# Patient Record
Sex: Male | Born: 1950 | Race: Black or African American | Hispanic: No | Marital: Single | Smoking: Former smoker
Health system: Southern US, Community
[De-identification: ages and names within clinical notes are randomized; demographics above are authoritative.]

## PROBLEM LIST (undated history)

## (undated) DIAGNOSIS — E119 Type 2 diabetes mellitus without complications: Secondary | ICD-10-CM

## (undated) DIAGNOSIS — I639 Cerebral infarction, unspecified: Secondary | ICD-10-CM

## (undated) DIAGNOSIS — N289 Disorder of kidney and ureter, unspecified: Secondary | ICD-10-CM

## (undated) DIAGNOSIS — C801 Malignant (primary) neoplasm, unspecified: Secondary | ICD-10-CM

## (undated) DIAGNOSIS — D649 Anemia, unspecified: Secondary | ICD-10-CM

## (undated) DIAGNOSIS — I10 Essential (primary) hypertension: Secondary | ICD-10-CM

## (undated) DIAGNOSIS — R569 Unspecified convulsions: Secondary | ICD-10-CM

---

## 2000-07-25 ENCOUNTER — Emergency Department (HOSPITAL_COMMUNITY): Admission: EM | Admit: 2000-07-25 | Discharge: 2000-07-25 | Payer: Self-pay | Admitting: *Deleted

## 2001-03-26 ENCOUNTER — Emergency Department (HOSPITAL_COMMUNITY): Admission: EM | Admit: 2001-03-26 | Discharge: 2001-03-26 | Payer: Self-pay | Admitting: *Deleted

## 2004-11-10 ENCOUNTER — Emergency Department (HOSPITAL_COMMUNITY): Admission: EM | Admit: 2004-11-10 | Discharge: 2004-11-10 | Payer: Self-pay | Admitting: Emergency Medicine

## 2005-06-27 ENCOUNTER — Inpatient Hospital Stay (HOSPITAL_COMMUNITY): Admission: EM | Admit: 2005-06-27 | Discharge: 2005-07-03 | Payer: Self-pay | Admitting: Emergency Medicine

## 2005-06-29 ENCOUNTER — Ambulatory Visit: Payer: Self-pay | Admitting: Internal Medicine

## 2005-07-02 ENCOUNTER — Ambulatory Visit: Payer: Self-pay | Admitting: *Deleted

## 2005-07-03 ENCOUNTER — Encounter (INDEPENDENT_AMBULATORY_CARE_PROVIDER_SITE_OTHER): Payer: Self-pay | Admitting: Specialist

## 2005-07-09 ENCOUNTER — Ambulatory Visit: Payer: Self-pay | Admitting: *Deleted

## 2005-07-14 ENCOUNTER — Ambulatory Visit: Payer: Self-pay | Admitting: Internal Medicine

## 2005-07-28 ENCOUNTER — Ambulatory Visit: Payer: Self-pay | Admitting: *Deleted

## 2005-08-04 ENCOUNTER — Ambulatory Visit: Payer: Self-pay | Admitting: *Deleted

## 2005-08-04 ENCOUNTER — Encounter (HOSPITAL_COMMUNITY): Admission: RE | Admit: 2005-08-04 | Discharge: 2005-09-03 | Payer: Self-pay | Admitting: *Deleted

## 2005-08-11 ENCOUNTER — Ambulatory Visit: Payer: Self-pay | Admitting: *Deleted

## 2005-08-24 ENCOUNTER — Ambulatory Visit: Payer: Self-pay | Admitting: Cardiology

## 2005-11-12 ENCOUNTER — Ambulatory Visit: Payer: Self-pay | Admitting: Internal Medicine

## 2005-11-13 ENCOUNTER — Ambulatory Visit (HOSPITAL_COMMUNITY): Payer: Self-pay | Admitting: Internal Medicine

## 2005-11-13 ENCOUNTER — Encounter (HOSPITAL_COMMUNITY): Admission: RE | Admit: 2005-11-13 | Discharge: 2005-12-13 | Payer: Self-pay | Admitting: Internal Medicine

## 2006-03-02 ENCOUNTER — Ambulatory Visit: Payer: Self-pay | Admitting: Internal Medicine

## 2006-03-08 ENCOUNTER — Ambulatory Visit (HOSPITAL_COMMUNITY): Admission: RE | Admit: 2006-03-08 | Discharge: 2006-03-08 | Payer: Self-pay | Admitting: Internal Medicine

## 2006-03-08 ENCOUNTER — Ambulatory Visit: Payer: Self-pay | Admitting: Internal Medicine

## 2006-03-15 ENCOUNTER — Ambulatory Visit: Payer: Self-pay | Admitting: Internal Medicine

## 2006-03-15 ENCOUNTER — Ambulatory Visit (HOSPITAL_COMMUNITY): Admission: RE | Admit: 2006-03-15 | Discharge: 2006-03-15 | Payer: Self-pay | Admitting: Internal Medicine

## 2007-10-10 ENCOUNTER — Emergency Department (HOSPITAL_COMMUNITY): Admission: EM | Admit: 2007-10-10 | Discharge: 2007-10-10 | Payer: Self-pay | Admitting: Emergency Medicine

## 2007-12-30 ENCOUNTER — Ambulatory Visit: Payer: Self-pay | Admitting: Gastroenterology

## 2008-05-25 DIAGNOSIS — F1210 Cannabis abuse, uncomplicated: Secondary | ICD-10-CM | POA: Insufficient documentation

## 2008-05-25 DIAGNOSIS — E119 Type 2 diabetes mellitus without complications: Secondary | ICD-10-CM | POA: Insufficient documentation

## 2008-05-25 DIAGNOSIS — E785 Hyperlipidemia, unspecified: Secondary | ICD-10-CM | POA: Insufficient documentation

## 2008-05-25 DIAGNOSIS — K922 Gastrointestinal hemorrhage, unspecified: Secondary | ICD-10-CM | POA: Insufficient documentation

## 2008-05-25 DIAGNOSIS — R569 Unspecified convulsions: Secondary | ICD-10-CM

## 2008-05-25 DIAGNOSIS — F17200 Nicotine dependence, unspecified, uncomplicated: Secondary | ICD-10-CM

## 2008-05-25 DIAGNOSIS — I6350 Cerebral infarction due to unspecified occlusion or stenosis of unspecified cerebral artery: Secondary | ICD-10-CM | POA: Insufficient documentation

## 2008-05-28 ENCOUNTER — Ambulatory Visit: Payer: Self-pay | Admitting: Internal Medicine

## 2008-05-28 ENCOUNTER — Encounter: Payer: Self-pay | Admitting: Gastroenterology

## 2008-05-31 LAB — CONVERTED CEMR LAB
HCT: 29.2 % — ABNORMAL LOW (ref 39.0–52.0)
Hemoglobin: 9.1 g/dL — ABNORMAL LOW (ref 13.0–17.0)

## 2008-08-07 DIAGNOSIS — I10 Essential (primary) hypertension: Secondary | ICD-10-CM | POA: Insufficient documentation

## 2008-08-07 DIAGNOSIS — Z8679 Personal history of other diseases of the circulatory system: Secondary | ICD-10-CM | POA: Insufficient documentation

## 2008-08-07 DIAGNOSIS — I252 Old myocardial infarction: Secondary | ICD-10-CM

## 2008-09-04 ENCOUNTER — Encounter: Payer: Self-pay | Admitting: Gastroenterology

## 2008-09-04 ENCOUNTER — Encounter (INDEPENDENT_AMBULATORY_CARE_PROVIDER_SITE_OTHER): Payer: Self-pay

## 2008-11-16 ENCOUNTER — Encounter (INDEPENDENT_AMBULATORY_CARE_PROVIDER_SITE_OTHER): Payer: Self-pay | Admitting: *Deleted

## 2008-11-20 ENCOUNTER — Emergency Department (HOSPITAL_COMMUNITY): Admission: EM | Admit: 2008-11-20 | Discharge: 2008-11-20 | Payer: Self-pay | Admitting: Emergency Medicine

## 2008-11-23 ENCOUNTER — Telehealth (INDEPENDENT_AMBULATORY_CARE_PROVIDER_SITE_OTHER): Payer: Self-pay | Admitting: *Deleted

## 2008-12-18 ENCOUNTER — Telehealth (INDEPENDENT_AMBULATORY_CARE_PROVIDER_SITE_OTHER): Payer: Self-pay

## 2009-01-02 ENCOUNTER — Ambulatory Visit: Payer: Self-pay | Admitting: Gastroenterology

## 2009-01-02 DIAGNOSIS — Q279 Congenital malformation of peripheral vascular system, unspecified: Secondary | ICD-10-CM | POA: Insufficient documentation

## 2009-01-03 LAB — CONVERTED CEMR LAB
Ferritin: 13 ng/mL — ABNORMAL LOW (ref 22–322)
HCT: 34.6 % — ABNORMAL LOW (ref 39.0–52.0)
Hemoglobin: 11 g/dL — ABNORMAL LOW (ref 13.0–17.0)

## 2009-01-04 ENCOUNTER — Encounter: Payer: Self-pay | Admitting: Gastroenterology

## 2009-01-08 ENCOUNTER — Encounter: Payer: Self-pay | Admitting: Orthopedic Surgery

## 2009-01-11 ENCOUNTER — Encounter: Payer: Self-pay | Admitting: Orthopedic Surgery

## 2009-01-11 ENCOUNTER — Emergency Department (HOSPITAL_COMMUNITY): Admission: EM | Admit: 2009-01-11 | Discharge: 2009-01-11 | Payer: Self-pay | Admitting: Emergency Medicine

## 2009-01-15 ENCOUNTER — Ambulatory Visit: Payer: Self-pay | Admitting: Orthopedic Surgery

## 2009-01-15 DIAGNOSIS — M25519 Pain in unspecified shoulder: Secondary | ICD-10-CM | POA: Insufficient documentation

## 2009-01-15 DIAGNOSIS — M7580 Other shoulder lesions, unspecified shoulder: Secondary | ICD-10-CM

## 2009-02-27 ENCOUNTER — Ambulatory Visit: Payer: Self-pay | Admitting: Orthopedic Surgery

## 2009-06-26 ENCOUNTER — Encounter: Payer: Self-pay | Admitting: Gastroenterology

## 2009-06-26 ENCOUNTER — Ambulatory Visit: Payer: Self-pay | Admitting: Gastroenterology

## 2009-06-26 DIAGNOSIS — D509 Iron deficiency anemia, unspecified: Secondary | ICD-10-CM | POA: Insufficient documentation

## 2009-06-26 DIAGNOSIS — R198 Other specified symptoms and signs involving the digestive system and abdomen: Secondary | ICD-10-CM

## 2009-06-27 LAB — CONVERTED CEMR LAB
Basophils Absolute: 0 10*3/uL (ref 0.0–0.1)
Eosinophils Relative: 4 % (ref 0–5)
Ferritin: 10 ng/mL — ABNORMAL LOW (ref 22–322)
Lymphocytes Relative: 36 % (ref 12–46)
Lymphs Abs: 2.2 10*3/uL (ref 0.7–4.0)
MCV: 93.9 fL (ref 78.0–100.0)
Monocytes Absolute: 0.7 10*3/uL (ref 0.1–1.0)
Platelets: 251 10*3/uL (ref 150–400)
RBC: 3.45 M/uL — ABNORMAL LOW (ref 4.22–5.81)
RDW: 14.8 % (ref 11.5–15.5)

## 2009-07-01 ENCOUNTER — Encounter: Payer: Self-pay | Admitting: Gastroenterology

## 2009-07-16 ENCOUNTER — Encounter (HOSPITAL_COMMUNITY): Admission: RE | Admit: 2009-07-16 | Discharge: 2009-08-15 | Payer: Self-pay | Admitting: Oncology

## 2009-07-16 ENCOUNTER — Ambulatory Visit (HOSPITAL_COMMUNITY): Payer: Self-pay | Admitting: Oncology

## 2009-07-26 ENCOUNTER — Ambulatory Visit (HOSPITAL_COMMUNITY): Admission: RE | Admit: 2009-07-26 | Discharge: 2009-07-26 | Payer: Self-pay | Admitting: Gastroenterology

## 2009-07-26 ENCOUNTER — Ambulatory Visit: Payer: Self-pay | Admitting: Gastroenterology

## 2009-08-20 ENCOUNTER — Encounter (HOSPITAL_COMMUNITY): Admission: RE | Admit: 2009-08-20 | Discharge: 2009-09-19 | Payer: Self-pay | Admitting: Oncology

## 2009-08-26 ENCOUNTER — Encounter: Payer: Self-pay | Admitting: Gastroenterology

## 2009-09-04 ENCOUNTER — Ambulatory Visit (HOSPITAL_COMMUNITY): Payer: Self-pay | Admitting: Oncology

## 2009-09-25 ENCOUNTER — Encounter: Payer: Self-pay | Admitting: Gastroenterology

## 2009-10-03 ENCOUNTER — Encounter (HOSPITAL_COMMUNITY): Admission: RE | Admit: 2009-10-03 | Discharge: 2009-11-02 | Payer: Self-pay | Admitting: Oncology

## 2009-10-10 ENCOUNTER — Ambulatory Visit: Payer: Self-pay | Admitting: Orthopedic Surgery

## 2009-11-27 ENCOUNTER — Encounter (HOSPITAL_COMMUNITY): Admission: RE | Admit: 2009-11-27 | Discharge: 2009-12-27 | Payer: Self-pay | Admitting: Oncology

## 2009-11-27 ENCOUNTER — Ambulatory Visit (HOSPITAL_COMMUNITY): Payer: Self-pay | Admitting: Oncology

## 2009-12-25 ENCOUNTER — Ambulatory Visit: Payer: Self-pay | Admitting: Gastroenterology

## 2010-01-06 ENCOUNTER — Ambulatory Visit: Payer: Self-pay | Admitting: Internal Medicine

## 2010-01-06 ENCOUNTER — Ambulatory Visit (HOSPITAL_COMMUNITY): Admission: RE | Admit: 2010-01-06 | Discharge: 2010-01-06 | Payer: Self-pay | Admitting: Internal Medicine

## 2010-01-07 ENCOUNTER — Encounter: Payer: Self-pay | Admitting: Gastroenterology

## 2010-01-08 ENCOUNTER — Ambulatory Visit (HOSPITAL_COMMUNITY): Admission: RE | Admit: 2010-01-08 | Discharge: 2010-01-08 | Payer: Self-pay | Admitting: Gastroenterology

## 2010-01-08 ENCOUNTER — Encounter: Payer: Self-pay | Admitting: Gastroenterology

## 2010-01-09 ENCOUNTER — Encounter: Payer: Self-pay | Admitting: Gastroenterology

## 2010-01-14 ENCOUNTER — Ambulatory Visit (HOSPITAL_COMMUNITY): Admission: RE | Admit: 2010-01-14 | Discharge: 2010-01-14 | Payer: Self-pay | Admitting: Gastroenterology

## 2010-01-28 ENCOUNTER — Encounter (HOSPITAL_COMMUNITY)
Admission: RE | Admit: 2010-01-28 | Discharge: 2010-02-27 | Payer: Self-pay | Source: Home / Self Care | Admitting: Oncology

## 2010-01-28 ENCOUNTER — Ambulatory Visit (HOSPITAL_COMMUNITY): Payer: Self-pay | Admitting: Oncology

## 2010-03-28 ENCOUNTER — Encounter (HOSPITAL_COMMUNITY)
Admission: RE | Admit: 2010-03-28 | Discharge: 2010-04-27 | Payer: Self-pay | Source: Home / Self Care | Attending: Oncology | Admitting: Oncology

## 2010-03-28 ENCOUNTER — Ambulatory Visit (HOSPITAL_COMMUNITY): Payer: Self-pay | Admitting: Oncology

## 2010-04-22 ENCOUNTER — Encounter (INDEPENDENT_AMBULATORY_CARE_PROVIDER_SITE_OTHER): Payer: Self-pay | Admitting: *Deleted

## 2010-05-13 NOTE — Assessment & Plan Note (Signed)
Summary: fu ov 6 mo,avms,anemia/ams   Visit Type:  Follow-up Visit Primary Care Provider:  Dierdre Forth  Chief Complaint:  f/u  anemia.  History of Present Illness: [name] is here for 6 month f/u. 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 He says he is doing well. Denies melena, constipation, abd pain, n/v, heartburn. He c/o 4 week h/o increased frequency of stools. He wakes up at 3am to have loose BM on daily basis. Stools range from solid to loose. He c/o pp loose stools with certain foods, like salad and cake. Stools have been black but he takes iron and Pepto. Denies weakness, fatigue, SOB. He did not see Dr. Mariel Sleet as requested by Dr. Darrick Penna. She referred him for IV iron. Patient states he was never called with an appt and he knew nothing about it.     Current Medications (verified): 1)  Metformin Hcl 500 Mg Tabs (Metformin Hcl) .... Take One Two Times A Day 2)  Simvastatin 20 Mg Tabs (Simvastatin) .... One Once Daily 3)  Dilantin 100 Mg Caps (Phenytoin Sodium Extended) .... Take 3 At Bedtime 4)  Omeprazole 20 Mg Tbec (Omeprazole) .... One By Mouth 30 Mins Before Breakfast 5)  Ferrex 150 150 Mg Caps (Polysaccharide Iron Complex) .... Take One By Mouth Twice Daily. 6)  Metoprolol Tartrate 25 Mg Tabs (Metoprolol Tartrate) .... Take 1 Tablet By Mouth Two Times A Day  Allergies (verified): No Known Drug Allergies  Past History:  Past Medical History: Last updated: 01/02/2009 GASTRIC AND DUODENAL AVMS Diabetes mellitus, type II Hyperlipidemia Hypertension Myocardial infarction, hx of Seizure disorder Cerebrovascular accident, hx of, 2003  Past Surgical History: Right shoulder surgery  Family History: Father- deceased, CVA Mother- deceased, CVA 7 sisters 1 living, 2 sisters had cancer 8 brothers, 3 had cancer FH of Cancer:  Family History Coronary Heart Disease male < 77 Family History of Arthritis Hx, family, chronic respiratory condition Hx, family,  kidney disease NEC No FH of CRC.  Social History: Single, lives with friend, one daughter Current Smoker- 1 pk/day for 40 yrs Alcohol use-no no caffeine use  Review of Systems      See HPI  Vital Signs:  Patient profile:   60 year old male Height:      70 inches Weight:      168 pounds BMI:     24.19 Temp:     98.4 degrees F oral Pulse rate:   64 / minute BP sitting:   142 / 80  (left arm) Cuff size:   regular  Vitals Entered By: Cloria Spring LPN (June 26, 2009 11:25 AM)  Physical Exam  General:  Well developed, well nourished, no acute distress. Head:  Normocephalic and atraumatic. Eyes:  Sclera nonicteric. Mouth:  OP moist. Lungs:  Clear throughout to auscultation. Heart:  Regular rate and rhythm; no murmurs, rubs,  or bruits. Abdomen:  Bowel sounds normal.  Abdomen is soft, nontender, nondistended.  No rebound or guarding.  No hepatosplenomegaly, masses or hernias.  No abdominal bruits.  Extremities:  No clubbing, cyanosis, edema or deformities noted. Neurologic:  Alert and  oriented x4;  grossly normal neurologically. Skin:  Intact without significant lesions or rashes. Psych:  Alert and cooperative. Normal mood and affect.  Impression & Recommendations:  Problem # 1:  ANEMIA, IRON DEFICIENCY, CHRONIC (ICD-280.9) Secondary to AVMs. Black stools likely due to iron/Pepto. Due for labs at this time. If needed, will refer him for IV iron again. Orders: T-CBC w/Diff [id number])  T-Ferritin [phone]) Est. Patient Level III (96295)  Problem # 2:  CHANGE IN BOWELS (ICD-787.99) Four to six week h/o diarrhea which wakes him up at 3am daily and occurs postprandially. Last TCS 2007. At that time had what appeared to be resolving infectious colitis. Will check stool studies. If negative and symptoms persist, would consider TCS vs. Flex sig. Decision also to be based on stability of H/H, ferritin. Orders: T-Culture, C-Diff Toxin A/B ([id number]) T-Culture, Stool  ([id number]) T-Stool for O&P ([id number]) Est. Patient Level III (03474)

## 2010-05-13 NOTE — Letter (Signed)
Summary: tcs/egd order  tcs/egd order   Imported By: Ave Filter 07/01/2009 15:08:05  _____________________________________________________________________  External Attachment:    Type:   Image     Comment:   External Document

## 2010-05-13 NOTE — Letter (Signed)
Summary: External Other  External Other   Imported By: Peggyann Shoals 09/25/2009 14:44:42  _____________________________________________________________________  External Attachment:    Type:   Image     Comment:   External Document

## 2010-05-13 NOTE — Assessment & Plan Note (Signed)
Summary: Wants a shot in his shoulder/? xray/frs   Visit Type:  Follow-up Referring Provider:  health department Primary Provider:  Dierdre Forth  CC:  SHOULDER PAIN.  History of Present Illness: I saw Mark Sanford in the office today for a followup visit.  He is a 60 years old man with the complaint of:  right shoulder bursitis.  02/27/09 last injection and was given Relafen, the Relafen 500 does not help anymore.  The injection lasted a long time, no new injury.  No pain med.  No new injuries  He has pain full forward elevation of the shoulder positive Neer sign  I injected his shoulder with steroids with lidocaine subacromial space was tolerated well  Allergies: No Known Drug Allergies   Impression & Recommendations:  Problem # 1:  IMPINGEMENT SYNDROME (ICD-726.2) Assessment Deteriorated  Verbal consent obtained/The shoulder was injected with depomedrol 40mg /cc and sensorcaine .25% . There were no complications  Orders: Est. Patient Level II (60454) Depo- Medrol 40mg  (J1030) Joint Aspirate / Injection, Large (20610)  Patient Instructions: 1)  You have received an injection of cortisone today. You may experience increased pain at the injection site. Apply ice pack to the area for 20 minutes every 2 hours and take 2 xtra strength tylenol every 8 hours. This increased pain will usually resolve in 24 hours. The injection will take effect in 3-10 days.  2)  Please schedule a follow-up appointment as needed.

## 2010-05-13 NOTE — Letter (Signed)
Summary: [facility]  [facility]   Imported By: Rexene Alberts 01/09/2010 10:43:53  _____________________________________________________________________  External Attachment:    Type:   Image     Comment:   External Document

## 2010-05-13 NOTE — Assessment & Plan Note (Signed)
Summary: ANEMIA, EA:VWUJ   Visit Type:  Initial Visit Primary Care Mark Sanford:  Health Department  Chief Complaint:  F/U anemia.  History of Present Illness: Saw Dr. Mariel Sleet and had IVFE. No melena or BRBPR. Sweets make him have loose stools. Otherwise, BMs: nl 1x/day. No abd pain, nausea, vomiting, heartburn/indigestion, or problems swallowing. Has not required blood transfusions.  FERRITIN: MAY 2011-13. AFTER IVFE: JUNE 2011 FERRITIN 250. AUGUST 2011 FERRITIN 94   Current Medications (verified): 1)  Metformin Hcl 500 Mg Tabs (Metformin Hcl) .... Take One Two Times A Day 2)  Simvastatin 20 Mg Tabs (Simvastatin) .... One Once Daily 3)  Dilantin 100 Mg Caps (Phenytoin Sodium Extended) .... Take 3 At Bedtime 4)  Omeprazole 20 Mg Tbec (Omeprazole) .... One By Mouth 30 Mins Before Breakfast 5)  Ferrex 150 150 Mg Caps (Polysaccharide Iron Complex) .... Take One By Mouth Twice Daily. 6)  Metoprolol Tartrate 25 Mg Tabs (Metoprolol Tartrate) .... 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  **MAR 2011: FERRITIN 10 **2007: TCS/EGD-GASTRIC AND DUODENAL AVMS/ABLATED-->CAPSULE ENDOSCOPY **DEC 2007: EGD/GASTRIC/DUODENAL AVMS ABLATED  Cerebrovascular accident, hx of, 2003 Myocardial infarction, hx of Diabetes mellitus, type II Hyperlipidemia Hypertension Seizure disorder  Family History: Father- deceased, CVA Mother- deceased, CVA 7 sisters 1 living, 2 sisters had cancer 8 brothers, 3 had cancer FH of Cancer:  Family History Coronary Heart  Disease male < 84 Family History of Arthritis Hx, family, chronic respiratory condition Hx, family, kidney disease NEC No FH of CRC or polyps  Social History: Single, lives with friend, one daughter Current Smoker- <1 pk/day, smoking for 40 yrs Alcohol use-no no caffeine use  Vital Signs:  Patient profile:   60 year old male Height:      70 inches Weight:      162 pounds BMI:     23.33 Temp:     98.5 degrees F oral Pulse rate:   72 / minute BP sitting:   124 / 74  (left arm) Cuff size:   regular  Vitals Entered By: Cloria Spring LPN (December 25, 2009 9:42 AM)  Physical Exam  General:  Well developed, well nourished, no acute distress. Head:  Normocephalic and atraumatic. Eyes:  PERRL, no icterus. Mouth:  No deformity or lesions. Lungs:  Clear throughout to auscultation. Heart:  Regular rate and rhythm; no murmurs. Abdomen:  Soft, nontender and nondistended. Normal bowel sounds. Extremities:  BIL trace pedal edema.   Neurologic:  Alert and  oriented x4;  grossly normal neurologically.  Impression & Recommendations:  Problem # 1:  ANEMIA, IRON DEFICIENCY, CHRONIC (ICD-280.9) Assessment Unchanged Pt on oral iron and received IVFE. Ferritin trending down. Pt has known Hx: gastric and duodenal AVMs. Pt has continued obscure slow GIB/FeDA. Need to complete evaluationof his SB. Willorder capsule endoscopy. OPV in 4 os. Continue by mouth iron and follow up with Dr. Jodene Nam.  CC:PCP AND DR. Mariel Sleet  Appended Document: ANEMIA, WJ:XBJY 4 MONTH F/U OPV IS IN THE COMPUTER  Appended Document: Orders Update    Clinical Lists Changes  Orders: Added new Service order of Est. Patient Level IV (78295) -  Signed

## 2010-05-13 NOTE — Letter (Signed)
Summary: CAPSULE ORDER  CAPSULE ORDER   Imported By: Ave Filter 12/25/2009 10:50:31  _____________________________________________________________________  External Attachment:    Type:   Image     Comment:   External Document  Appended Document: GIVENS results Dictated #[id number].  Pt given results, agrees w/ plan for SBFT [id number] GIVENS, persistent IDA.  Please arrange.  Thanks  Appended Document: CAPSULE ORDER Pt scheduled 01/08/10@8 :30a.m.

## 2010-05-13 NOTE — Letter (Signed)
Summary: External Other  External Other   Imported By: Curtis Sites 09/26/2009 11:04:54  _____________________________________________________________________  External Attachment:    Type:   Image     Comment:   External Document

## 2010-05-13 NOTE — Letter (Signed)
Summary: TCS ORDER  TCS ORDER   Imported By: Ave Filter 01/08/2010 11:52:40  _____________________________________________________________________  External Attachment:    Type:   Image     Comment:   External Document

## 2010-05-13 NOTE — Letter (Signed)
Summary: SBFT ORDER  SBFT ORDER   Imported By: Ave Filter 01/07/2010 11:34:39  _____________________________________________________________________  External Attachment:    Type:   Image     Comment:   External Document

## 2010-05-15 NOTE — Letter (Signed)
Summary: Recall Office Visit  [facility]  [address]   [place], [place] [address]   Phone: [phone]  Fax: [phone]      April 22, 2010   KELWIN GIBLER [address] [place], [place]  [address] 05/11/1951   Dear Mr. KLEM,   According to our records, it is time for you to schedule a follow-up office visit with [place].   At your convenience, please call [phone] to schedule an office visit. If you have any questions, concerns, or feel that this letter is in error, we would appreciate your call.   Sincerely,    Diana Eves  [facility] Ph: [phone]   Fax: [phone]

## 2010-05-22 ENCOUNTER — Encounter (HOSPITAL_COMMUNITY): Payer: Medicaid Other | Attending: Oncology

## 2010-05-22 ENCOUNTER — Other Ambulatory Visit (HOSPITAL_COMMUNITY): Payer: Medicaid Other

## 2010-05-22 DIAGNOSIS — D509 Iron deficiency anemia, unspecified: Secondary | ICD-10-CM | POA: Insufficient documentation

## 2010-05-23 ENCOUNTER — Ambulatory Visit (HOSPITAL_COMMUNITY): Payer: Medicaid Other | Admitting: Oncology

## 2010-05-23 DIAGNOSIS — D509 Iron deficiency anemia, unspecified: Secondary | ICD-10-CM

## 2010-05-23 DIAGNOSIS — D72819 Decreased white blood cell count, unspecified: Secondary | ICD-10-CM

## 2010-06-23 LAB — CBC
HCT: 35.1 % — ABNORMAL LOW (ref 39.0–52.0)
MCHC: 36.5 g/dL — ABNORMAL HIGH (ref 30.0–36.0)
RBC: 3.7 MIL/uL — ABNORMAL LOW (ref 4.22–5.81)
RDW: 12.1 % (ref 11.5–15.5)

## 2010-06-25 LAB — CBC
Hemoglobin: 12.8 g/dL — ABNORMAL LOW (ref 13.0–17.0)
MCH: 35.4 pg — ABNORMAL HIGH (ref 26.0–34.0)
MCHC: 35 g/dL (ref 30.0–36.0)
WBC: 3.9 10*3/uL — ABNORMAL LOW (ref 4.0–10.5)

## 2010-06-25 LAB — GLUCOSE, CAPILLARY: Glucose-Capillary: 142 mg/dL — ABNORMAL HIGH (ref 70–99)

## 2010-06-25 LAB — BASIC METABOLIC PANEL: Chloride: 106 mEq/L (ref 96–112)

## 2010-06-26 LAB — CBC
HCT: 35.8 % — ABNORMAL LOW (ref 39.0–52.0)
Hemoglobin: 12.4 g/dL — ABNORMAL LOW (ref 13.0–17.0)
RBC: 3.63 MIL/uL — ABNORMAL LOW (ref 4.22–5.81)
WBC: 4.6 10*3/uL (ref 4.0–10.5)

## 2010-06-29 LAB — CBC
HCT: 37 % — ABNORMAL LOW (ref 39.0–52.0)
Hemoglobin: 12.4 g/dL — ABNORMAL LOW (ref 13.0–17.0)
MCV: 96.1 fL (ref 78.0–100.0)
RBC: 3.85 MIL/uL — ABNORMAL LOW (ref 4.22–5.81)
WBC: 5.2 10*3/uL (ref 4.0–10.5)

## 2010-07-01 LAB — CBC
Hemoglobin: 10.9 g/dL — ABNORMAL LOW (ref 13.0–17.0)
MCHC: 34.2 g/dL (ref 30.0–36.0)
MCV: 90.4 fL (ref 78.0–100.0)
RBC: 3.51 MIL/uL — ABNORMAL LOW (ref 4.22–5.81)

## 2010-07-02 LAB — CBC
HCT: 32 % — ABNORMAL LOW (ref 39.0–52.0)
Hemoglobin: 10.6 g/dL — ABNORMAL LOW (ref 13.0–17.0)
MCHC: 33.2 g/dL (ref 30.0–36.0)
RDW: 15.4 % (ref 11.5–15.5)

## 2010-08-26 NOTE — Assessment & Plan Note (Signed)
NAME:  Mark Sanford, Mark Sanford                  CHART#:  [id number]   DATE:  12/30/2007                       DOB:  05/11/1951   REFERRING Kallen Delatorre:  Vertis Kelch of [facility].   PROBLEM LIST:  1. Diabetes.  2. Hyperlipidemia.  3. Gastric and duodenal AVMs.  4. History of stroke.  5. Seizure disorder 6 years ago.   SUBJECTIVE:  The patient is a 60 year old male who was last seen in  December 2007.  Dr. Stacie Glaze performed an EGD with thermal ablation of  gastric and duodenal AVMs.  He had a workup for anemia and heme-positive  stools, which included a small bowel capsule, which revealed two gastric  AVMs and two small bowel AVMs.  He also had a capsule endoscopy, which  was performed in November 2007.  He also had an EGD and a colonoscopy in  March 2007, which revealed three gastric AVMs, which were ablated,  enterogastritis, and resolving colitis.  He also had a small adenomatous  polyp.  He reports he has not seen any black stools like tar or bright  red blood per rectum.  He is having brown stool.  His bowel movements  per day depends on what he eats.  He denies any heartburn or  indigestion.  He has problem with swallowing his pills.  His teeth  sometimes make him nauseous.   ALLERGIES:  No known drug allergies.   MEDICATIONS:  1. Metformin.  2. Simvastatin.  3. Lisinopril/HCTZ.  4. Dilantin.  5. Metoprolol.  6. Omeprazole 20 mg b.i.d.  7. Glimepiride.   FAMILY HISTORY:  He denies any family history of colon cancer or colon  polyps.   SOCIAL HISTORY:  He has been married for 30 years.  He has one  biological daughter with three grandchildren and one great grandchild.  He is disabled from a stroke.  He smokes one pack a day.  He  occasionally consumes alcohol.  He smokes weed. It helps him sleep and  relax.   OBJECTIVE:   PHYSICAL EXAMINATION:  VITAL SIGNS:  Weight 159 pounds (down 9 pounds  since November 2007), height 5 feet 10 inches, temperature 97.7,  blood  pressure 158/70, pulse 80, BMI 22.8 (he is healthy).GENERAL:  He is in  no apparent distress.  Alert and oriented x4.HEENT:  Atraumatic and  normocephalic.  Pupils, equal and reactive to light.MOUTH:  No oral  lesions.  Posterior pharynx without erythema or exudate.NECK:  Full  range of motion.  No lymphadenopathy.LUNGS:  Clear to  auscultationbilaterally.CARDIOVASCULAR:  Regular rhythm.  Normal S1 and  S2.ABDOMEN:  Bowel sounds are present, soft, nontender, and  nondistended.  No rebound or guarding.  EXTREMITIES:  No cyanosis, clubbing, or edema.NEURO:  He has no focal  neurologic deficits.   ASSESSMENT:  The patient is a 59 year old male who has a history of  gastric and duodenal arteriovenous malformation requiring ablation in  March 2007 and November 2007.  He is currently not taking any iron.  Thank you for allowing me to see the patient in consultation.  My  recommendations are as follows.   RECOMMENDATIONS:  1. Will check hemoglobin and hematocrit today.  If his hemoglobin is      low, then he needs to restart his iron.  I will recommend Bifera.  2. Screening  colonoscopy in 2017.  3. He could consider repeat EGD with ablation.  4. He has a follow up appointment in 6 months.  Will call him in [phone] with the results of his labs.       Kassie Mends, M.D.  Electronically Signed     SM/MEDQ  D:  12/30/2007  T:  12/30/2007  Job:  [id number]   cc:   Vertis Kelch

## 2010-08-26 NOTE — Assessment & Plan Note (Signed)
NAME:  AXTYN, WOEHLER                  CHART#:  [id number]   DATE:  05/28/2008                       DOB:  05/11/1951   CHIEF COMPLAINT:  Followup for anemia.   PROBLEM LIST:  1. Diabetes.  2. Hyperlipidemia.  3. Gastric and duodenal AVMs.  4. History of stroke.  5. Seizure disorder 6 years ago.   SUBJECTIVE:  The patient is a 60 year old gentleman.  He was last seen  in September 2009 by Dr. Cira Servant.  He has a history of gastric and  duodenal AVMs with anemia.  Last endoscopic evaluation was in 2007.  He  has been doing well.  He had his hemoglobin checked in September 2009.  Hemoglobin was 11.1, hematocrit 34, which had improved.  He is not on  iron therapy.  He denies any black stools, bright red blood per rectum,  constipation, diarrhea, melena or rectal bleeding, nausea or vomiting,  heartburn, abdominal pain, dysphagia, or odynophagia.   CURRENT MEDICATIONS:  Metformin, simvastatin, lisinopril, Dilantin,  metoprolol, omeprazole, and glimepiride.   ALLERGIES:  No known drug allergies.   PHYSICAL EXAMINATION:  VITAL SIGNS:  Weight 168, up 9 pounds; temp 97.8;  blood pressure 142/68; and pulse 72.  GENERAL:  Pleasant, well-nourished, well-developed, black gentleman in  no acute distress.  SKIN:  Warm and dry.  No jaundice.  HEENT:  Sclerae nonicteric.  Oropharyngeal mucosa moist and pink.  ABDOMEN:  Positive bowel sounds.  Abdomen is soft, nontender, and  nondistended.  No organomegaly or masses.  No rebound or guarding.  No  abdominal bruits or hernias.  LOWER EXTREMITIES:  No edema.   IMPRESSION:  The patient is a 60 year old gentleman with a history of  gastric and duodenal arteriovenous malformations requiring ablation in  March and November 2007.  He is currently not taking any iron.  He is  due for a followup H&H today.  He also voices concerns with limited  followup with his primary care physician lately.  He states he is having  trouble getting an appointment and  reaching the location due to where he  lives.  I provided him with a few other primary care physicians to make  contact with to consider establishing care.  I discussed with  him today the need to follow up regularly with the PCP of this chronic  medical conditions, and he is in agreement.       Tana Coast, P.A.  Electronically Signed     Kassie Mends, M.D.  Electronically Signed    LL/MEDQ  D:  05/28/2008  T:  05/28/2008  Job:  [id number]   cc:   Dierdre Forth, NP

## 2010-08-29 NOTE — Group Therapy Note (Signed)
NAME:  Mark Sanford, Mark Sanford NO.:  [id number]   MEDICAL RECORD NO.:  [id number]          PATIENT TYPE:  INP   LOCATION:  ED99                          FACILITY:  [facility]   PHYSICIAN:  Margaretmary Dys, M.D.DATE OF BIRTH:  1950-04-17   DATE OF PROCEDURE:  06/28/2005  DATE OF DISCHARGE:                                   PROGRESS NOTE   SUBJECTIVE:  The patient continues to have cough.  He otherwise has no other  complaints.  He denies any abdominal pain.  He has no diarrhea.  He has  actually had some nausea but no vomiting.  The patient feels slightly  better.  My biggest concern as to what is going on with his abnormal CT of  his abdomen and pelvis and also his chest x-ray being abnormal.  There is a  possible viral process to explain this.   OBJECTIVE:  GENERAL:  Conscious, alert patient in mild respiratory distress  and continuously coughing.  VITAL SIGNS:  His blood pressure on arrival here was 112/62; pulse 97;  respirations 16; temperature 99.4 degrees Fahrenheit.  HEENT:  Normocephalic, atraumatic.  Oral mucosa was dry.  No exudates were  noted.  NECK:  Supple.  No JVD, no lymphadenopathy.  LUNGS:  Bilateral coarse crackles were heard throughout the lungs.  No  rhonchi or wheezing.  HEART:  S1, S2 regular.  No S3, S4, gallops, or rubs.  ABDOMEN:  Soft, nontender.  Bowel sounds were positive.  No masses palpable.  EXTREMITIES:  No edema.  No induration or tenderness was noted.   LABORATORY DATA:  CT scan of the abdomen and pelvis shows there is wall  thickening and stranding involving the cecum, ascending colon, and  transverse colon proximally.  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  Cecal wall thickening was also noted on CT of  his pelvis.  His chest x-ray showed increase perihilar markings suggesting  viral pneumonitis, and a high-resolution CT scan  of his chest obtained  showed bilateral interstitial infiltrates with no significant adenopathy  noted.   A CT with contrast could not be obtained due to the patient having received  contrast with his abdominal CT scan earlier in the day.   ASSESSMENT AND PLAN:  Mark Sanford is a 60 year old, African-American male who  presented to the emergency room initially with testicular pain and cough.  He said he noted the pain when he work up earlier in the morning.  Evaluation in the emergency room, however, revealed extensively abnormal CT  scan with possibility of colitis.   He also had an abnormal chest x-ray and an abnormal CT scan showing  interstitial infiltrates, both concerning for possible viral infection,  especially CMV.  Pneumonitis.  However, much more interestingly, the patient  was found to have a severe anemia, with a hemoglobin of 4.5 and hematocrit  of 15.6.  His stool for occult blood was noted to be negative.   The patient has received 2 units of packed  red blood cells.   Stools have also been checked for Clostridium difficile, and they are  negative.   The only positive thing was that he had positive leukocytes and many  bacteria in his urine.  With his history of testicular pain, this raises the  possibility of epididymo-orchitis.   PROBLEM LIST:  1.  Severe anemia, with an H&H of 4.5 and 15.6, of unclear etiology, MCV of      67.1, and MCHC is 28.8.  This showed microcytic, hypochromic anemia,      suggesting iron deficiency and chronic blood loss.  I have requested GI      to see him for evaluation.  2.  History of testicular pain.  The patient's urine appears infected.  Will      put him on some Levaquin and some Zosyn.  The Zosyn is to provide      coverage for abdominal pathogens.  3.  Abnormal CT of the abdomen and pelvis.  Will evaluate with GI.  The      patient may have evidence of ischemic colitis or even possibly      infectious colitis by CMV.  4.   History of diabetes.  Please note that the patient is on metformin, and      not methadone.  Records need to be changes, as it is wrongly indicated      in his ER account sheet.  5.  CMV viral load is pending at this time.  HIV test is also pending.  So      also is GI consult.  I have requested for sputum cultures, also      Influenza A titers.  Will continue to monitor very closely.      Margaretmary Dys, M.D.  Electronically Signed     AM/MEDQ  D:  06/28/2005  T:  06/28/2005  Job:  [id number]

## 2010-08-29 NOTE — Op Note (Signed)
NAME:  Mark Sanford, Mark Sanford NO.:  [id number]   MEDICAL RECORD NO.:  [id number]          PATIENT TYPE:  AMB   LOCATION:  DAY                           FACILITY:  [facility]   PHYSICIAN:  Lionel December, M.D.    DATE OF BIRTH:  [birth year]   DATE OF PROCEDURE:  03/09/2006  DATE OF DISCHARGE:                               OPERATIVE REPORT   PROCEDURE:  Small bowel given capsule study.   INDICATIONS:  [name] is a 60 year old Afro American male with history of  recurrent anemia and heme-positive stools.  He was evaluated earlier in  the year with EGD and colonoscopy.  He recently presented with very low  hemoglobin and heme-positive stools.  He is therefore undergoing this  study.  The procedure risks were reviewed the patient, informed consent  was obtained.   FINDINGS:  The patient was able to swallow given capsule without any  difficulty.   Capsule made it in the stomach and 1 minute and 37 seconds and into the  duodenum in 1 hour and 16 minutes and to the cecum in 3 hours and 53  minutes.   Two small arteriovenous malformations were noted at gastric body or  antrum along with granularity and erythema suggestive of gastritis.   There was a single nonbleeding arteriovenous malformation in the  duodenal bulb and another one in jejunum or ileum.  Both of these did  not have stigmata of bleed.  There was a focal lymphangiectasia seen on  the frame at 1 hour and 52 minutes.   FINAL DIAGNOSIS:  1. Nonerosive gastritis.  2. Two gastric arteriovenous malformations and two in small bowel( one      in bulb and second one distally) without stigmata of bleed.   It is possible that he has been losing blood from these vascular  abnormalities.   RECOMMENDATIONS:  We will offer EGD with coagulation of these lesions.  We will review records to make sure he has had H pylori serology,  otherwise will need to be done.   He will have H&H repeated at the time of endoscopy.      Lionel December, M.D.  Electronically Signed     NR/MEDQ  D:  03/09/2006  T:  03/09/2006  Job:  [id number]

## 2010-08-29 NOTE — Procedures (Signed)
NAME:  Mark Sanford, Mark Sanford NO.:  [id number]   MEDICAL RECORD NO.:  [id number]          PATIENT TYPE:  INP   LOCATION:  A302                          FACILITY:  [facility]   PHYSICIAN:  Olga Millers, M.D. LHCDATE OF BIRTH:  [birth year]   DATE OF PROCEDURE:  07/01/2005  DATE OF DISCHARGE:                                  ECHOCARDIOGRAM   INDICATIONS:  The patient is a 60 year old male who has a history of  diabetes mellitus and hypertension.  The study is performed to quantify left  ventricular function.  It is technically adequate.   The two-dimensional measurements are as follows:  The left ventricular end-  diastolic dimension is 5.7 cm.  The left ventricular end-systolic dimension  is 4.1 cm.  The septum is 1.6 cm and the posterior wall is 1.2 cm.  The  aorta is 2.9 cm and the left atrium is 4.3 cm.   The overall left ventricular function appears to be mild to moderately  reduced.  The ejection fraction is estimated at 40-45%.  There is mild  global hypokinesis and there is moderate hypokinesis of the mid and distal  septum.  There is mild concentric left ventricular hypertrophy as well as  mild left ventricular enlargement.  The left atrium is also mildly enlarged.  The right atrium and right ventricle appear to be normal in size and the  right ventricular function is normal.  There is a small pericardial  effusion.  The aortic valve is trileaflet and there is no aortic stenosis or  aortic insufficiency.  The mitral valve is mildly thickened and there is  mild mitral regurgitation.  There is mild tricuspid regurgitation.   FINAL INTERPRETATION:  1.  Mild to moderate global reduction in left ventricular function (with      moderate hypokinesis of the mid and distal septum); estimated ejection      fraction 40-45%.  2.  Mild concentric left ventricular hypertrophy.  3.  Mild left ventricular enlargement.  4.  Mild left atrial enlargement.  5.  Mild mitral and  tricuspid regurgitation.           ______________________________  Olga Millers, M.D. [facility]     BC/MEDQ  D:  07/01/2005  T:  07/02/2005  Job:  [id number]

## 2010-08-29 NOTE — H&P (Signed)
NAME:  GOLDEN, GILREATH                 ACCOUNT NO.:  [id number]   MEDICAL RECORD NO.:  [id number]          PATIENT TYPE:  INP   LOCATION:  ED99                          FACILITY:  [facility]   PHYSICIAN:  Toby L. Fugate, D.O.   DATE OF BIRTH:  1951/01/31   DATE OF ADMISSION:  06/27/2005  DATE OF DISCHARGE:  LH                                HISTORY & PHYSICAL   PRIMARY CARE PHYSICIAN:  ______________   CHIEF COMPLAINT:  Nausea, vomiting, fever, weakness.   HISTORY OF PRESENT ILLNESS:  Mr.  Mark Sanford is a 60 year old male who presented  to the ED tonight complaining of nausea, vomiting, fever and weakness. In  the ED, he was found to have a hemoglobin of 4.5. He denies blood per  rectum. However, he says his stools may have gotten a little bit darker. He  has been having diarrhea over the past day or two. Also has been having  abdominal pain. He have been having nausea and vomiting two to three times a  day. His fevers have been present for two to three days as well. In addition  to the severe anemia, the patient was found to have a leukocytosis of 15.9  with predominance of neutrophils. Chest x-rays showed increased perihilar  markers (question viral pneumonia). There was also mild cardiomegaly noted.  When I saw the patient, the his main complaints were nausea, vomiting and  continued fevers. He was already in the process of receiving his second unit  of packed red blood cells by the time I saw him. He appeared to be  tolerating it well. The fevers definitely predated the transfusion. The  patient denied chest pain.   PAST MEDICAL HISTORY:  1.  Diabetes.  2.  Seizure disorder. The patient cannot recall when his last seizure was.  3.  Possible chronic pain disorder. The patient is on methadone.   MEDICATIONS:  1.  Avandia 8 mg p.o. daily.  2.  Phenytoin. The patient cannot recall the dose.  3.  Methadone. The patient cannot recall the dose.   ALLERGIES:  No known drug allergies.   FAMILY HISTORY:  Both parents were relatively healthy.   SOCIAL HISTORY:  The patient denies alcohol, tobacco and IV drug abuse.   REVIEW OF SYSTEMS:  Complete 12-point review of systems was obtained. The  review was negative except for that in HPI.   PHYSICAL EXAMINATION:  VITAL SIGNS:  Temperature 102.3, blood pressure  108/53, pulse 103, respiratory rate 20.  HEENT:  Pupils equally round and reactive to light. Extraocular movements  intact. No scleral icterus. Oropharynx clear and moist. No erythema or  thrush.  NECK:  No JVD. No carotid bruits. No adenopathy.  HEART:  Regular rate and rhythm. No murmurs, rubs, or gallops.  LUNGS:  Bilateral crackles.  ABDOMEN:  Positive bowel sounds. Diffuse minimal tenderness.  EXTREMITIES:  No edema, clubbing or cyanosis.  NEUROLOGICAL:  Cranial nerves II-XII grossly intact. No focal deficits. DTRs  2/4 in all extremities. Strength 5/5 in all extremities.   LABORATORY DATA:  Sodium 136, potassium 4.0, chloride 104,  carbon dioxide  23, glucose 134, BUN 14, creatinine 1.3, calcium 8.3. White blood cell count  14.9, hemoglobin 4.5, hematocrit 15.6, platelets 99. Phenytoin level 2.8.   ASSESSMENT AND PLAN:  1.  Fevers and chills. The etiology is not very clear at this point.      Diagnosis is pneumonia versus gastroenteritis versus other. I will admit      the patient to a general medicine bed. I will start Rocephin and      azithromycin to cover community-acquired pneumonia. I will order blood      cultures, urine cultures and sputum cultures. I will request clostridium      difficile and stool for leukocytes. I will also request a HIV test.  2.  Nausea, vomiting and abdominal pain. Again the etiology is not very      clear. It is concerning the patient is quite anemic. I am concerned for      GI malignancy, etc. As such, given a patient with abdominal pain, I will      order CT of the abdomen. I will control the patient's nausea with       Phenergan.  3.  Anemia/possible GI bleed. I will Hemoccult patient's stools. He      currently refuses rectal exam. Nurse tells me that a rectal exam was      done earlier, but she cannot recall the result of the Hemoccult. The      patient has already been transfused 1-1/2 units of blood. I will      continue to transfuse the patient tonight. I plan on giving him at least      four units and then rechecking his hemoglobin. The patient is currently      stable. I will consult GI tomorrow.  4.  Thrombocytopenia. Etiology is not clear. I will repeat labs in the      morning.  5.  Diabetes. I will hold the patient's medications given that he will be      n.p.o.  6.  This patient is a full code.      Toby L. Fugate, D.O.  Electronically Signed     TLF/MEDQ  D:  06/27/2005  T:  06/29/2005  Job:  [id number]

## 2010-08-29 NOTE — Op Note (Signed)
NAME:  Mark Sanford, Mark Sanford NO.:  [id number]   MEDICAL RECORD NO.:  [id number]          PATIENT TYPE:  INP   LOCATION:  A302                          FACILITY:  [facility]   PHYSICIAN:  Lionel December, M.D.    DATE OF BIRTH:  09/19/1950   DATE OF PROCEDURE:  07/03/2005  DATE OF DISCHARGE:                                 OPERATIVE REPORT   PROCEDURE:  Esophagogastroduodenoscopy followed by colonoscopy.   INDICATIONS:  Mark Sanford is a 60 year old African-American male admitted with  nausea, vomiting, and diarrhea and noted to be profoundly anemic; and has  received 4 units of PRBCs.  On CT he had thickened raising suspicion of  colitis.  He has been treated with Flagyl; and has improved.  He also has  cardiomyopathy; and is being evaluated by Dr. Dorethea Clan.  He is undergoing  diagnostic examination.  Procedure and risks were reviewed with the patient  and informed consent was obtained.   MEDICINES FOR CONSCIOUS SEDATION:  Benzocaine spray for pharyngeal topical  anesthesia, Demerol 25 mg IV, Versed 4 mg IV.   FINDINGS:  Procedure performed in endoscopy suite.  The patient's vital  signs and O2 saturations were monitored during procedure and remained  stable.  The patient was placed in left lateral position.   PROCEDURE #1 ESOPHAGOGASTRODUODENOSCOPY:  The patient was placed in left  lateral position and Olympus videoscope was passed via oropharynx without  any difficulty into esophagus.   ESOPHAGUS:  There was an 8 to 9 mm submucosal lesion at 25 cm from the  incisors, which was either a small leiomyoma or a lipoma.  This was left  alone.  Mucosa of the rest of the esophagus was normal.  GE junction was at  41 cm from the incisors.   STOMACH:  It was empty and distended very well with insufflation.  Folds of  the proximal stomach were normal.  Examination of the mucosa revealed 3  AVMs, two at the body and one in the antrum.  The one at body was the  biggest of the three.  None of  these were bleeding.  These are ablated with  the help of the argon plasma coagulator with good hemostasis.  There was a  small polyp at distal gastric body along the posterior wall which was  ablated via cold biopsy.  There was a patchy granularity and erythema at  antrum.  Pyloric channel was patent.  Angularis, fundus, and cardia were  examined by retroflexing the scope and were normal.   DUODENUM:  Bulbar mucosa was normal.  Scope was passed into the second part  of duodenum where mucosa and folds were normal.  Endoscope was withdrawn and  the patient prepared for procedure #2.   PROCEDURE #2 COLONOSCOPY:  Rectal examination performed.  No abnormality  noted on external or digital exam.  Olympus videoscope was placed into  rectum and advanced under vision into sigmoid colon and beyond.  Preparation  was excellent.  Scope was passed into the cecum.  Cecum was identified by  ileocecal valve and appendiceal orifice.  Pictures were  taken for the  record.  As the scope was withdrawn colonic mucosa was carefully examined.  There were a few erosions involving ascending and transverse colon, but  there were no pseudomembranes.  Mucosa of the distal half of the colon was  normal.  A few tiny diverticula were noted at the descending and sigmoid  colon.  There was a 3-4 mm polyp at rectum which was ablated by cold biopsy.  Anorectal junction was examined by retroflexing the scope and were normal.  Endoscope was straightened and withdrawn.  The patient tolerated the  procedure well.   FINAL DIAGNOSIS:  Multiple findings which are:  1.  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  2.  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  3.  Antral gastritis.  4.  Helicobacter pylori infection needs to be ruled out.  5.  Small gastric polyp ablated via cold biopsy from gastric body.   6.  Mild patchy colitis involving proximal colon, possibly viral and/or      infectious.  Feel that this is resolving; and since he is responding to      Flagyl, we need to finish up on therapy.  Endoscopic appearance not      consistent with a pseudomembranous colitis and inflammatory bowel      disease.  7.  A few diverticula involving descending and sigmoid colon.  8.  Small polyp ablated by cold biopsy from rectum.   RECOMMENDATIONS:  1.  H. pylori serology will be checked.  2.  He needs to be on a PPI.  3.  No ASA for at least 2 weeks.  4.  I will be contacting the patient with the results of biopsy.      Lionel December, M.D.  Electronically Signed     NR/MEDQ  D:  07/03/2005  T:  07/06/2005  Job:  [id number]   cc:   Dr. Rito Ehrlich

## 2010-08-29 NOTE — Discharge Summary (Signed)
NAME:  Mark Sanford, Mark Sanford NO.:  [id number]   MEDICAL RECORD NO.:  [id number]          PATIENT TYPE:  INP   LOCATION:  A302                          FACILITY:  [facility]   PHYSICIAN:  Osvaldo Shipper, MD     DATE OF BIRTH:  Jul 20, 1950   DATE OF ADMISSION:  06/27/2005  DATE OF DISCHARGE:  03/23/2007LH                                 DISCHARGE SUMMARY   DISCHARGE DIAGNOSES:  1.  Severe anemia, unclear etiology, likely gastrointestinal loss.  2.  Likely infectious colitis, getting better.  3.  Cardiomyopathy requiring further evaluation.  4.  History of seizure disorder, stable.  5.  History of type 2 diabetes, stable.   Please refer to H&P dictated at the time of admission for details regarding  patient's presenting illness.   HOSPITAL COURSE:  #2.  ANEMIA:  Briefly, this is a 60 year old African-American male who  presented actually to the ED complaining of nausea, vomiting, fever, and  weakness. He was found to have a hemoglobin of 4.5 .  He mentioned that he  had been having some dark stools but denied any blood in his stool.  He also  gave history of diarrhea.  To further evaluate his anemia, the patient was  consulted by gastroenterologist.  He was scheduled for EGD and colonoscopy.  The patient was also transfused multiple units of blood to improve his  hemoglobin.  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  Gastric AVMs which were ablated. There was a small polyp at the gastric body  which was removed with cold biopsy.  Gastritis was seen at the antrum.  Colonoscopy showed colitis at the right and transverse colon which is  consistent with infectious colitis and seemed to be improving.  Diverticula  noted. Rectal polyp was also ablated by cold biopsy.  Based on this, it was  recommended that the patient not be on aspirin for at least 2 weeks.  If the  patient has  recurrence of anemia bleeding, he may require further evaluation  in the form of a small-bowel capsule study.   #2.  CARDIAC:  The hospital stay was complicated by pulmonary edema.  The  patient was not getting a large amount of IV fluids, but he went into  pulmonary edema.  His B-natruretic peptide was elevated, going as high as  1680; today it is down to 1280.  Cardiac enzymes were all negative.  He  underwent echocardiogram which showed mild to moderately reduced LV function  with an EF at 40 to 45%. Mild global hypokinesis, and moderate hypokinesis  of mid and distal septum were noted. Mild concentric left ventricular  hypertrophy as well as mild left ventricular enlargement was also noted.  Based on this report, cardiology was consulted who mentioned that patient  will need further workup for his cardiomyopathy.  He definitely has risk  factors to have ischemic cardiomyopathy.  The patient was started on ACE  inhibitor and will be starting the patient on a beta  blocker.  The patient  will be set up to see Dr. Dorethea Clan in his office sometime next week to discuss  further evaluation for his cardiac status.  The patient's respiratory status  has returned to baseline. He has diuresed well.  His lungs are completely  clear today even though he has elevated BNP.  He will be put on Lasix at  discharge.   #3.  TYPE 2 DIABETES:  Has remained stable.  He will continue outpatient  medications as before which include Avandia and metformin.   #4.  COLITIS: The patient is still having some diarrhea which has also  improved.  He was found to have colitis on CT as well as on colonoscopy.  He  has been put on Flagyl, and he should improve on this regimen.   #5.  SEIZURE DISORDER:  This has been stable.  Continue on Dilantin.   On the day of discharge, vital signs remain stable.  He is doing well. He  tolerated the colonoscopy and EGD well and is considered stable for  discharge.   DISCHARGE  MEDICATIONS:  1.  Captopril 12.5 mg 3 times a day.  2.  Coreg 3.125 mg twice daily.  3.  Flagyl 250 mg p.o. 3 times a day for 10 more days.  4.  Lasix 20 mg p.o. once daily.  5.  KCl 10 mEq p.o. once daily.   He may continue other medications as before.  He has been told not to take  aspirin for 2 weeks.  He is asked not to take any pain medications before  talking to his physician.   FOLLOW UP:  1.  Primary care physician in 2 to 3 weeks.  2.  Dr. Dorethea Clan next week to discuss further evaluation of cardiomyopathy.   CONSULTATIONS:  1.  Lionel December, M.D.  2.  Vida Roller, M.D.   DIET:  Heart healthy diet.   ACTIVITY:  No restrictions.   IMAGING STUDIES DONE DURING HOSPITALIZATION:  1.  CT scan abdomen and pelvis which showed findings of colitis as discussed      above.  2.  CT chest showed bilateral air space opacities with multiple      differentials offered.      Osvaldo Shipper, MD  Electronically Signed     GK/MEDQ  D:  07/03/2005  T:  07/03/2005  Job:  [id number]   cc:   Vida Roller, M.D.  Fax: [phone]   Lionel December, M.D.  [address]  [place]  [place] [address]

## 2010-08-29 NOTE — Procedures (Signed)
NAME:  ROTH, RESS NO.:  [id number]   MEDICAL RECORD NO.:  [id number]          PATIENT TYPE:  INP   LOCATION:  A302                          FACILITY:  [facility]   PHYSICIAN:  Vida Roller, M.D.   DATE OF BIRTH:  [birth year]   DATE OF PROCEDURE:  DATE OF DISCHARGE:  07/03/2005                                    STRESS TEST   HISTORY:  Mr. Irizarry s a 60 year old male with recent hospitalization for  anemia requiring transfusion of packed red blood cells leading to congestive  heart failure.  Echocardiogram during hospitalization revealed an EF of 40-  45%.   BASELINE DATA:  Electrocardiogram reveals sinus rhythm at 60 beats per  minute, nonspecific ST abnormalities.  Blood pressure  158/78.   The patient exercised for a total of 5 minutes 34 seconds, Bruce protocol,  stage II, and 7.0 METS.  Maximum heart rate was 140 beats per minute, which  is 84% of predicted maximum.  Maximum blood pressure was 192/68 and resolved  down to 148/72 in recovery.  EKG revealed some upsloping ST depression in  the inferolateral leads, few PVCs and few PACs. The patient reported  shortness of breath with exercise.  No chest discomfort was noted.   Final images and results are pending MD review.      Jae Dire, P.A. [facility]      Vida Roller, M.D.  Electronically Signed    AB/MEDQ  D:  08/04/2005  T:  08/05/2005  Job:  [id number]

## 2010-08-29 NOTE — Op Note (Signed)
NAME:  Mark Sanford, AYLWARD NO.:  [id number]   MEDICAL RECORD NO.:  [id number]          PATIENT TYPE:  AMB   LOCATION:  DAY                           FACILITY:  [facility]   PHYSICIAN:  Lionel December, M.D.    DATE OF BIRTH:  05/11/1951   DATE OF PROCEDURE:  03/15/2006  DATE OF DISCHARGE:                               OPERATIVE REPORT   PROCEDURE:  Esophagogastroduodenoscopy with thermal ablation of gastric  and duodenal AVMs.   INDICATIONS:  Mr. Bray is a 60 year old African American male who  presents with recurrent anemia and heme positive stools.  He was  hospitalized back in March of this year with profound anemia and  required 4 units of blood.  He had three gastric angiodysplasias ablated  with the use of APC.  He also had patchy colitis treated with Flagyl  which was felt to be possibly infection.  All of his symptoms have  resolved, but he is still anemic and his stool was heme positive.  He  was given 2 units of PRBC back in August, as well.  Please note, he was  treated for H. pylori gastritis in March or April of this year.  The  procedure risks were reviewed with the patient, informed consent was  obtained.   We saw him in the office recently and he underwent a small bowel Given  capsule study which revealed two gastric AVMs and one in the bulb.  A  fourth one was located distally.  He is, therefore, returning for a  therapeutic EGD hoping to control his chronic GI blood loss.  Please  note that he has also been treated for H. pylori gastritis back in March  or April this year.   MEDS FOR CONSCIOUS SEDATION:  Benzocaine spray pharyngeal topical  anesthesia, Demerol 50 mg IV, Versed 6 mg IV.   FINDINGS:  The procedure was performed in the endoscopy suite.  The  patient's vital signs and O2 sat were monitored during the procedure and  remained stable.  The patient was placed in the left lateral position  and the Olympus videoscope was passed via oropharynx  without any  difficulty into the esophagus.   Esophagus:  The mucosa of the esophagus was normal.  A 7-8 mm submucosal  lesion at 25 cm from the incisors felt to be submucosal leiomyoma felt  to be unchanged since previous exam in March 2007.  GE junction was at  41 cm from the incisors with a wavy appearance.   Stomach:  The was empty and distended very well insufflation.  Folds of  the proximal stomach were normal.  Examination of the mucosa revealed  that two small prepyloric AVMs which were coagulated using snare tip.  There was another smaller lesion at gastric fundus which was also  treated in a similar fashion.  Focal erythema was noted at the GE  junction best seen on retroflexion and felt to be focal gastritis.  This  was ablated in a similar fashion.  A focal area of erythema noted at the  GE junction.   Duodenum:  The bulbar mucosa was normal.  The scope was passed to the  second part of the duodenum.  Two small AVMs were noted in the post  bulbar duodenum, both of which not bleeding and coagulated using snare  tip.  No bleeding was induced during this process.  The endoscope was  withdrawn.  The patient tolerated the procedure well.   FINAL DIAGNOSIS:  1. 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  2. Three small gastric AVMs and two post bulbar duodenal AVMs were      coagulated using snare tip.  None of these lesions was bleeding.   PLAN:  Will check a CBC today.  Will also draw red top in case iron  studies, B12, and folate need to be rechecked.      Lionel December, M.D.  Electronically Signed     NR/MEDQ  D:  03/15/2006  T:  03/15/2006  Job:  [id number]

## 2010-08-29 NOTE — Consult Note (Signed)
NAME:  Mark Sanford, Mark Sanford NO.:  [id number]   MEDICAL RECORD NO.:  [id number]          PATIENT TYPE:  INP   LOCATION:  A302                          FACILITY:  [facility]   PHYSICIAN:  Vida Roller, M.D.   DATE OF BIRTH:  03-02-1951   DATE OF CONSULTATION:  07/02/2005  DATE OF DISCHARGE:                                   CONSULTATION   REFERRING PHYSICIAN:  Toby L. Fugate, D.O.   PRIMARY CARE PHYSICIAN:  Dierdre Forth, M.D., [facility]  Department.   HISTORY OF PRESENT ILLNESS:  Mr. Saulters is a 60 year old man who was  admitted to [facility] with an acute anemia and found to have what  is thought to be a GI bleed, probably an upper GI bleed.  He had some  history of black tarry stools.  He was admitted, hydrated and developed  congestive heart failure.  He was aggressively treated with IV diuretics and  had significant improvement.  We were asked to comment about his suitability  for undergoing upper and lower endoscopy and for long-term treatment of his  congestive heart failure and his cardiomyopathy.  He had an echocardiogram  done yesterday which showed an ejection fraction of 40-45% with global  hypokinesis and mild mitral regurgitation and tricuspid regurgitation.  He  is a relatively poor historian, unfortunately, but denies any chest  discomfort or shortness of breath prior to his problem with this diarrhea  and the weakness that brought him into the hospital.  He has diabetes,  hypertension, history of seizure disorder, chronic pain and hyperlipidemia.   SOCIAL HISTORY:  He lives in [place] with his daughter.  He is single.  He has a 40-pack-year smoking history and actively smokes.  He drinks two  glasses of liquor about two to three times a week.  He denies any illicit  drug abuse.   MEDICATIONS PRIOR TO ADMISSION:  1.  Avandia 8 mg once a day.  2.  Phenytoin 300 mg once a day.  3.  Methadone at unknown dose.  4.  Simvastatin 20  mg a day.  5.  BC powder on an as needed basis for headaches.  6.  Lisinopril/hydrochlorothiazide 20/25 mg once a day.   CURRENT MEDICATIONS:  1.  Levaquin.  2.  Flagyl IV.  3.  Protonix 40 mg a day.  4.  Dilantin 300 mg nightly.  5.  Zosyn 3.375 mg IV q.4h.  6.  Sliding scale insulin.  7.  Potassium supplementation.   FAMILY HISTORY:  Mother died of diabetes and hypertension complications.  Father died of a stroke.  He has no idea of what their ages were.  He has 14  siblings.  He does not know their past medical history.   REVIEW OF SYSTEMS:  Generally negative, although I am not certain he really  understood much of what I was asking of him.  He did describe the melena and  diabetes.  He did have some dyspnea on exertion and productive cough prior  to this, but the remainder of this review of systems was  negative.   PHYSICAL EXAMINATION:  GENERAL:  He is a well-developed, pleasant, African-  American male in no apparent distress.  He is alert and oriented x3, but has  limited insight into his disease process.  VITAL SIGNS:  Pulse is 93, respirations 20, blood pressure 133/69,  saturations 97% on room air.  HEENT:  Unremarkable.  NECK:  Supple.  There is no jugular venous distention or carotid bruits.  CARDIAC:  Regular.  He does have an S4.  There is no obvious murmur noted.  Point of maximal impulse is not displaced.  LUNGS:  He has decreased breath sounds bilaterally at the bases.  SKIN:  No significant skin rashes.  GENITALIA:  Deferred.  BREASTS:  Deferred.  RECTAL:  Deferred.  ABDOMEN:  Soft and nontender.  EXTREMITIES:  Without significant clubbing, cyanosis or edema.  Pulses are  2+ throughout without bruit.  NEUROLOGIC:  Grossly nonfocal.   LABORATORY DATA AND X-RAY FINDINGS:  He has had multiple CT scans of his  chest and pelvis.  The results are essentially not particularly relevant to  the cardiovascular history.  His electrocardiogram shows sinus rhythm at  a  rate of 88 with normal axis and normal intervals with the exception of a  slightly prolonged QT interval at 537 msec.  This calculated on the EKG and  I do not measure that quite that extensively and do not think the man has a  long QT syndrome.  He does have poor R wave progression and does have left  ventricular hypertrophy by voltage.   White blood count 7.2, H&H 9 and 28 after significant transfusion, platelet  count 199.  Sodium 136, potassium 3.0, chloride 103, bicarb 25, BUN 1,  creatinine 1.0, blood sugar 104.  Liver function studies are within normal  limits.  TSH is 0.374.  Iron, TIBC and the percent saturation all show a  significant iron deficiency anemia.  Hemoglobin A1c of 5.7.  Phenytoin level  of 2.8.  His BNP is 1680.  INR 1.2, PT 15.  Urinalysis shows white blood  cells, a few red blood cells and many bacteria.   ASSESSMENT:  This is a man with acute congestive heart failure with  depressed left ventricular systolic function in the setting of acute fluid  rehydration for hypovolemic shock secondary to acute versus chronic  gastrointestinal blood loss, significant anemia and multiple cardiac risk  factors.  My plan is to add an angiotensin-converting enzyme inhibitor at a  low dose and probably start off with Captopril  at 6.25 mg three times a  day.  He is not a candidate for either antiplatelet or anticoagulation.   RECOMMENDATIONS:  I think he probably needs a little bit of gentle diuresis  and that is perfectly fine to use Lasix, however, they will need to replace  his potassium as aggressively as they can and probably ought to check a  magnesium to make sure that his not particularly depressed given his  relatively extensive diuresis over the course of the last day or so.  I  think his perioperative risk for having a complication with conscious  sedation for the upper endoscopy and lower endoscopy are relatively low given the fact that his electrocardiogram  shows no acute ischemia.  He  probably benefits from cycling his cardiac enzymes, however, and I would  recommend we do that and probably recheck a B-type natriuretic peptide.  I  suspect this is relatively low comparative to the one recorded.  Other  than that, I will be interested to know what the results are.  He will  eventually need an evaluation for his cardiomyopathy and I think he probably  benefits from a heart catheterization once it is clear and the source of his  bleeding and that this has been determined.      Vida Roller, M.D.  Electronically Signed     JH/MEDQ  D:  07/02/2005  T:  07/03/2005  Job:  [id number]

## 2010-08-29 NOTE — Consult Note (Signed)
NAME:  DANN, GALICIA NO.:  [id number]   MEDICAL RECORD NO.:  [id number]          PATIENT TYPE:  INP   LOCATION:  ED99                          FACILITY:  [facility]   PHYSICIAN:  R. Roetta Sessions, M.D. DATE OF BIRTH:  11/13/1950   DATE OF CONSULTATION:  DATE OF DISCHARGE:                                   CONSULTATION   REQUESTING PHYSICIAN:  Toby L. Fugate, D.O.   REASON FOR CONSULTATION:  Profound anemia, colitis on CT.   HISTORY OF PRESENT ILLNESS:  Mr. Glidden is a 60 year old African-American  male who reports a two-day history of nausea, vomiting, fever, weakness,  diarrhea and coughing. He presented to the emergency department. At that  time, he stated he had had approximately three episodes of vomiting a day  for the last day. He had had multiple episodes of dark watery diarrhea. He  denies any outright blood or melena, however. He complained of some  generalized abdominal pain since he arrived at the hospital. It has been  made worse by coughing. He complains of a productive cough of clear sputum.  Upon arrival, he was found to have a hemoglobin of 4.5. He has now received  4 units of packed RBCs. His hemoglobin is currently 9.1. He had a CT of the  abdomen and pelvis which showed colonic wall thickening of the cecum,  ascending colon and proximal transverse colon as well as partially occlusive  left renal vein thrombosis and a small amount of free pelvic fluid. His  stool culture has been negative for growth for 24 hours. His clostridium  difficile was negative, and he did not have any fecal WBCs. He has been  started on Zosyn since admission.   He denies any heartburn, indigestion, dysphagia, odynophagia, anorexia,  early satiety. He denies any rectal bleeding prior to this. He does note he  has about one episode of diarrhea a week that is nonbloody. He denies any  history of peptic ulcer disease. Denies any history of anemia or the need  for  transfusion.   PAST MEDICAL HISTORY:  1.  Diabetes mellitus.  2.  Seizure disorder.   He is on methadone for chronic pain. However, he does not have many details  on this.   MEDICATIONS PRIOR TO ADMISSION:  1.  Avandia 8 mg daily.  2.  Phenytoin unknown dose.  3.  Methadone unknown dose.  4.  Occasional B.C. Powder.   ALLERGIES:  No known drug allergies.   FAMILY HISTORY:  No known family history of colorectal carcinoma, liver or  chronic GI problems. Mother deceased secondary to diabetes mellitus and  hypertension. Father deceased secondary to CVA.   FAMILY HISTORY:  He has 14 siblings, but Mr. Bosserman does recall much of  their medical histories.   SOCIAL HISTORY:  Mr. Wirick is single. He has a 40+-pack-year history of  tobacco use. Reports alcohol use, drinking about two glasses of liquor twice  a week. Denies any drug use.   REVIEW OF SYSTEMS:  CONSTITUTIONAL:  Weight is stable. Does report chills  and has had  a low-grade fever. CARDIOVASCULAR:  Denies any chest pain or  palpitations. PULMONARY:  See HPI. Denies any shortness of breath.  HEMATOLOGICAL:  Denies any history of anemias or blood dyscrasias. Denies  any history of sickle cell disease.   PHYSICAL EXAMINATION:  VITAL SIGNS:  Weight 161 pounds stated. Temperature  100.6, pulse 108, respirations 17, blood pressure 150/88.  GENERAL:  Mr. Heady is a 60 year old African-American male who is alert and  oriented x3 although he does seem somewhat confused intermittently.  HEENT:  Sclerae are clear, nonicteric. Conjunctivae pale. Oropharynx pink  and moist without any lesions.  NECK:  Supple without any mass or thyromegaly.  CHEST:  Heart rate with mild tachycardia around 108. No clicks, rubs or  gallops.  LUNGS:  With decreased breath sounds at the right base. There is crackles at  the left base. No acute distress.  ABDOMEN:  Positive bowel sounds x4. No bruits ausculted. Soft, nontender,  nondistended without  palpable mass or hepatosplenomegaly. No rebound  tenderness or guarding.  RECTAL:  No external lesions visualized. Good sphincter tone. No internal  masses palpated. Small amount of light brown stool was obtained from the  vault which was Hemoccult negative.  EXTREMITIES:  Without clubbing or edema bilaterally.   LABORATORY DATA:  WBCs 15.9, platelets 99. He had a MCV of 67.1 with a  hemoglobin of 4.5 and a hematocrit of 15.6 on upon admission. Calcium 8.1,  sodium 136, potassium 4.3, chloride 101, CO2 25, BUN 10, creatinine 1.2 and  glucose 141. Phosphorous 2.2, magnesium 2.1. TSH normal. Amylase 44.  Phenytoin 2.8, magnesium 2.1. He was HIV negative. Urinalysis reveals a  small amount of blood, protein, leukocytes and bacteria.   IMPRESSION:  Mr. Proby is a 60 year old African-American male with a two-  day history of severe weakness, nausea, vomiting, fever, cough, diarrhea and  abdominal pain. He was found to have profound anemia with a hemoglobin of  4.5, now up to 9.1 this morning, status post 4 units of packed RBCs. His CT  of the abdomen and pelvis shows thickening at the cecum, ascending and  proximal transverse colon, partial occlusive left renal vein thrombosis and  a small amount of free pelvic fluid. His stool was negative for clostridium  difficile and WBCs, and his 24-hour culture has been negative. He was felt  to have bilateral pneumonia, suspected viral etiology. Mr. Sasso has acute  colitis on CT. Etiology unknown which includes infection and ischemia.  Interestingly, his stools have been Hemoccult negative, at least three times  to my knowledge. He has never had a colonoscopy.   PLAN:  1.  Dr. Jena Gauss will review CT with the radiologist.  2.  Will check PT, INR and LFTs.  3.  Agree with transfusions to keep H&P stable.  4.  _______________ pending.  5.  Anemia profile.  6.  We will plan for EGD and colonoscopy in the very near future by Dr.      Jena Gauss.  We  would like to thank Dr. Catalina Pizza for allowing [place] to participate in the care  of Mr. Walmsley.      Nicholas Lose, N.P.      Jonathon Bellows, M.D.  Electronically Signed    KC/MEDQ  D:  06/29/2005  T:  06/29/2005  Job:  [id number]   cc:   Vertis Kelch, N.P.  [facility] Department

## 2010-09-17 ENCOUNTER — Other Ambulatory Visit: Payer: Self-pay

## 2010-09-18 MED ORDER — POLYSACCHARIDE IRON COMPLEX 150 MG PO CAPS: 150.0000 mg | ORAL_CAPSULE | Freq: Two times a day (BID) | ORAL | Status: AC

## 2010-11-07 ENCOUNTER — Other Ambulatory Visit (HOSPITAL_COMMUNITY): Payer: Medicaid Other

## 2010-11-10 ENCOUNTER — Ambulatory Visit (HOSPITAL_COMMUNITY): Payer: Medicaid Other | Admitting: Oncology

## 2011-01-08 LAB — BASIC METABOLIC PANEL
CO2: 27
Calcium: 9
GFR calc Af Amer: >60
Sodium: 139

## 2011-01-08 LAB — POCT CARDIAC MARKERS
Myoglobin, poc: 43
Troponin i, poc: <0.05

## 2011-01-12 ENCOUNTER — Other Ambulatory Visit: Payer: Self-pay

## 2011-01-12 MED ORDER — OMEPRAZOLE MAGNESIUM 20 MG PO TBEC: 20.0000 mg | DELAYED_RELEASE_TABLET | Freq: Every day | ORAL | Status: DC

## 2011-01-14 ENCOUNTER — Other Ambulatory Visit: Payer: Self-pay | Admitting: Gastroenterology

## 2011-01-14 DIAGNOSIS — K219 Gastro-esophageal reflux disease without esophagitis: Secondary | ICD-10-CM

## 2011-01-14 MED ORDER — OMEPRAZOLE 20 MG PO CPDR: 20.0000 mg | DELAYED_RELEASE_CAPSULE | Freq: Every day | ORAL | Status: DC

## 2011-03-13 IMAGING — CR DG SHOULDER 2+V*R*
3 series · 3 of 3 positions shown · non-contrast
Comparison: 10/10/2007

CLINICAL DATA: Right shoulder pain

RIGHT SHOULDER - 2+ VIEW

[view not recorded (1 of 3)]
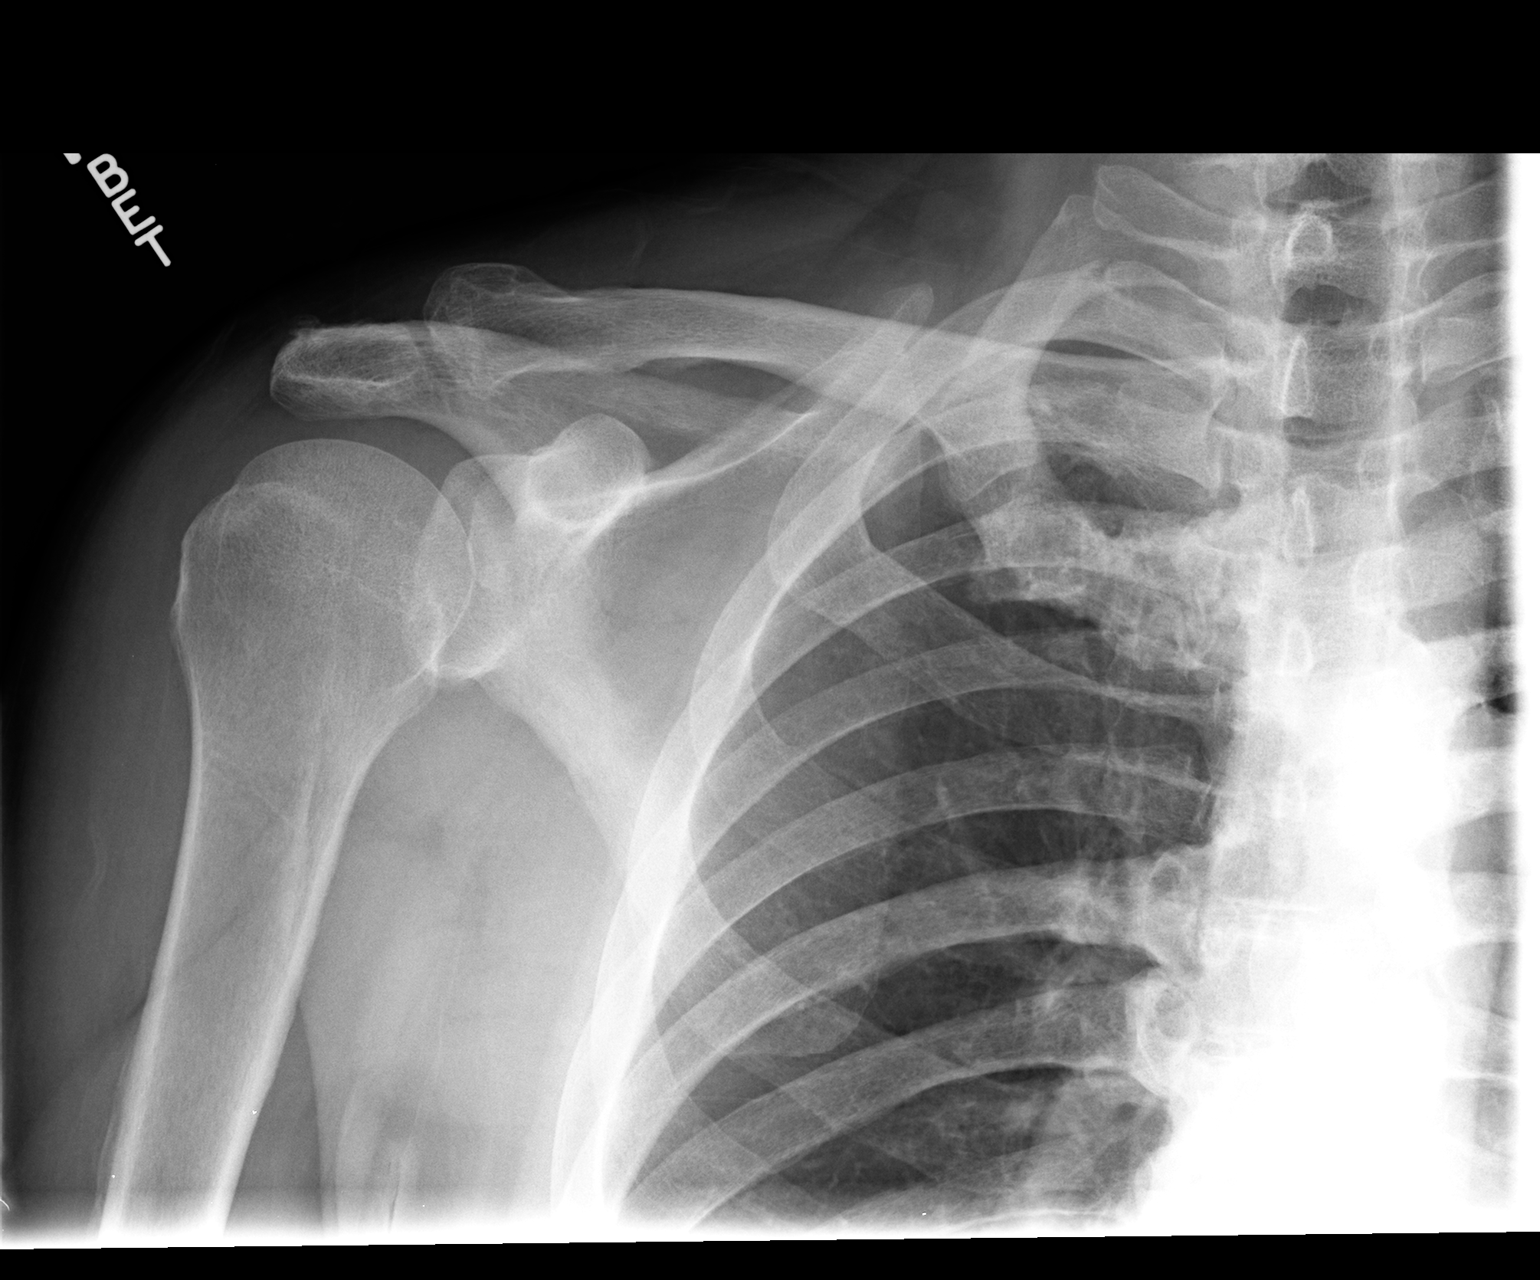

[view not recorded (2 of 3)]
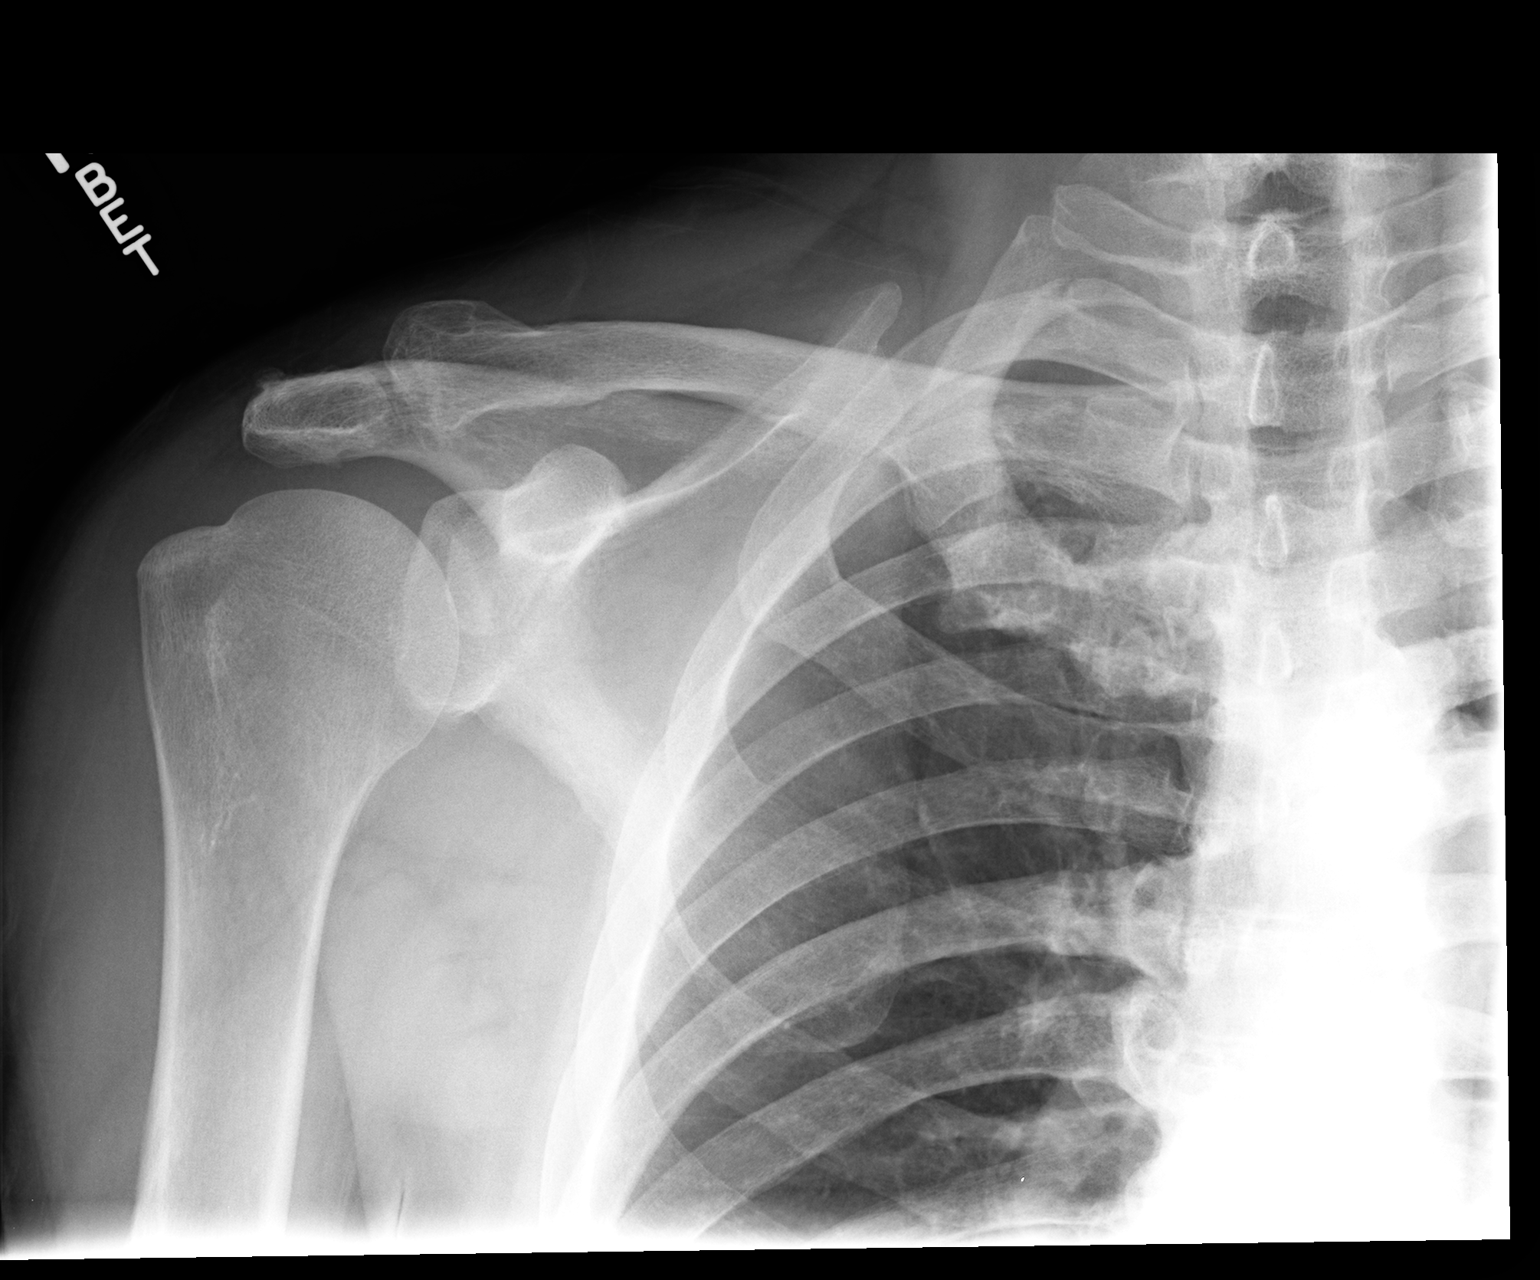

[view not recorded (3 of 3)]
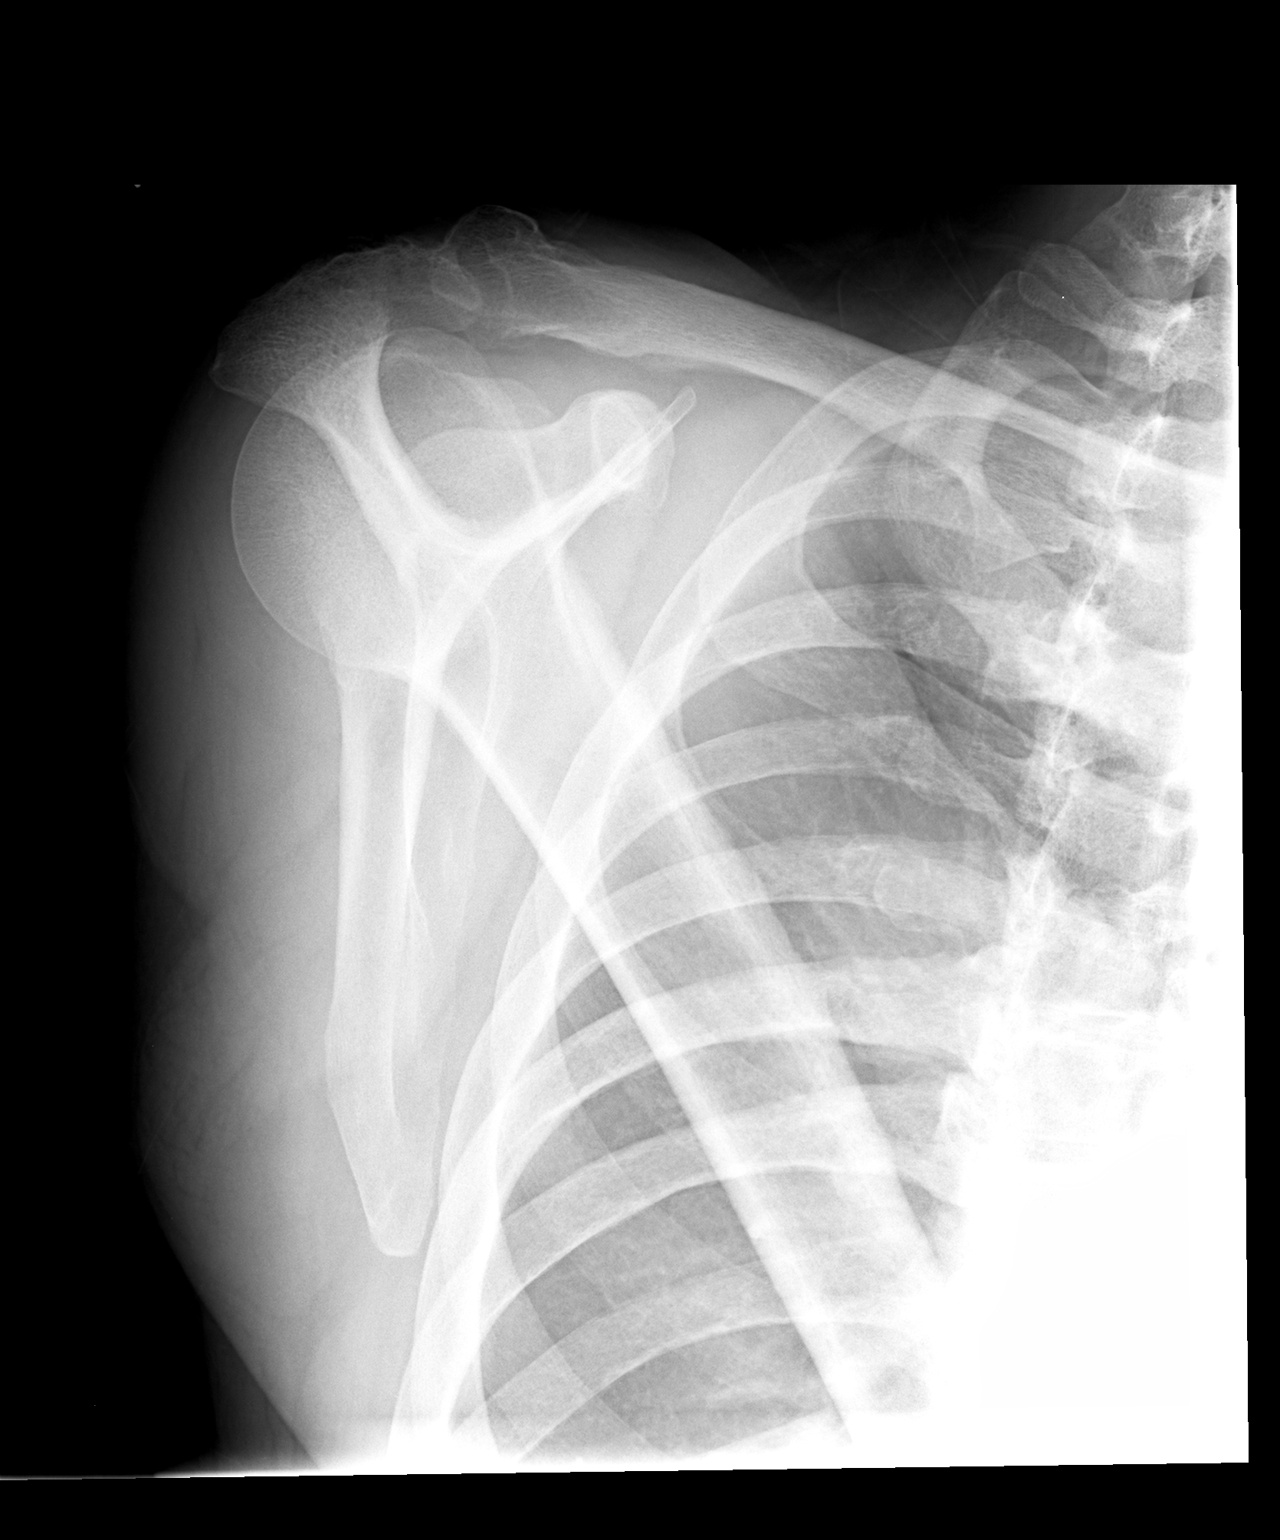

[3 of 3 positions shown; findings below may reference images not displayed]

FINDINGS: Minimal degenerative changes of the right AC joint.
No acute fracture or dislocation.
Bone mineralization appears fairly stable since previous study.
Visualized right ribs intact.
IMPRESSION: Minimal right AC joint degenerative changes.
No acute abnormalities.

## 2011-08-24 ENCOUNTER — Other Ambulatory Visit (HOSPITAL_COMMUNITY): Payer: Self-pay | Admitting: Nephrology

## 2011-08-24 DIAGNOSIS — N289 Disorder of kidney and ureter, unspecified: Secondary | ICD-10-CM

## 2011-09-03 ENCOUNTER — Ambulatory Visit (HOSPITAL_COMMUNITY)
Admission: RE | Admit: 2011-09-03 | Discharge: 2011-09-03 | Disposition: A | Discharge status: Home / Self Care | Payer: Medicaid Other | Source: Ambulatory Visit | Attending: Nephrology | Admitting: Nephrology

## 2011-09-03 DIAGNOSIS — E119 Type 2 diabetes mellitus without complications: Secondary | ICD-10-CM | POA: Insufficient documentation

## 2011-09-03 DIAGNOSIS — I10 Essential (primary) hypertension: Secondary | ICD-10-CM | POA: Insufficient documentation

## 2011-09-03 DIAGNOSIS — N400 Enlarged prostate without lower urinary tract symptoms: Secondary | ICD-10-CM | POA: Insufficient documentation

## 2011-09-03 DIAGNOSIS — N289 Disorder of kidney and ureter, unspecified: Secondary | ICD-10-CM | POA: Insufficient documentation

## 2012-03-09 IMAGING — CR DG SMALL BOWEL
3 series · 3 of 3 positions shown · non-contrast
Comparison: None

CLINICAL DATA: Incomplete video capsule assessment, iron deficiency
anemia

SMALL BOWEL FOLLOW THROUGH:
TECHNIQUE: Scout image was obtained.  Patient drank contrast and a
series of images of the small bowel were obtained.  Once contrast
had reached the colon, compression views of the terminal ileum were
performed.
Total fluoroscopy time: 2.3 minutes

[view not recorded (1 of 3)]
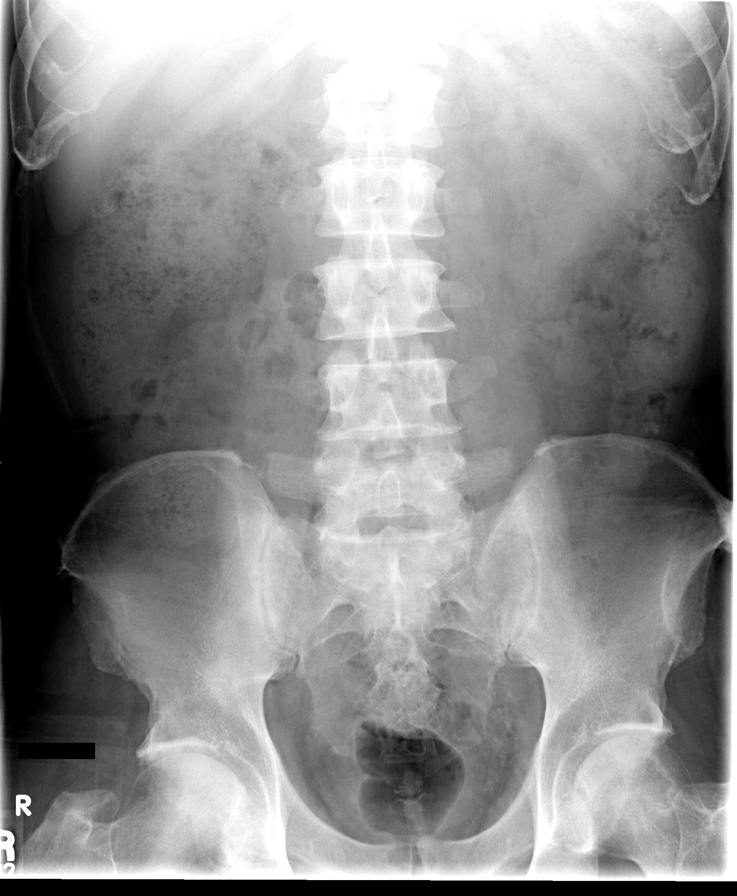

[view not recorded (2 of 3)]
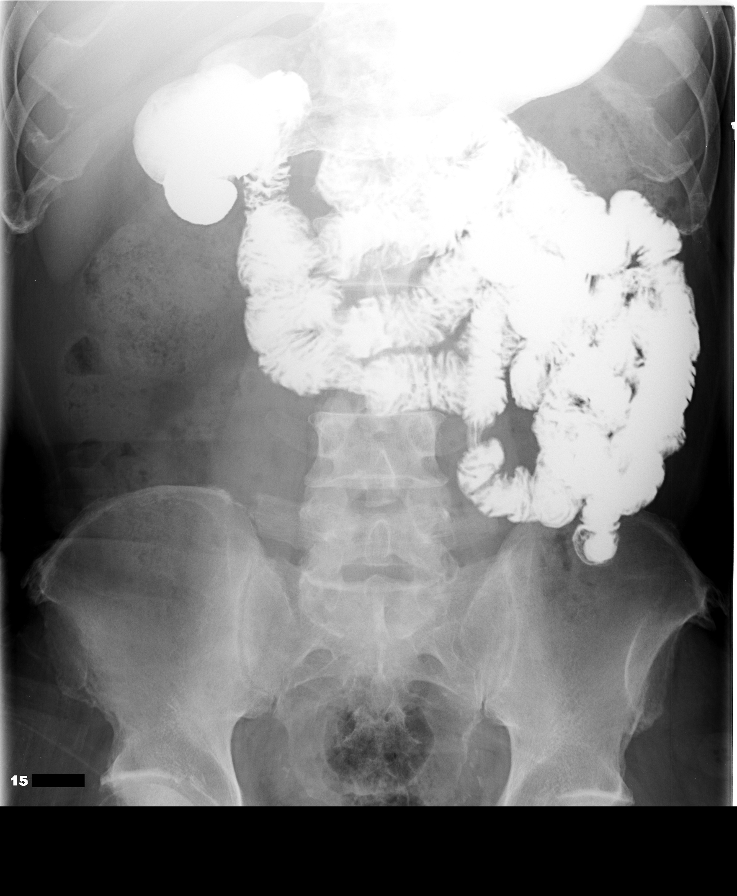

[view not recorded (3 of 3)]
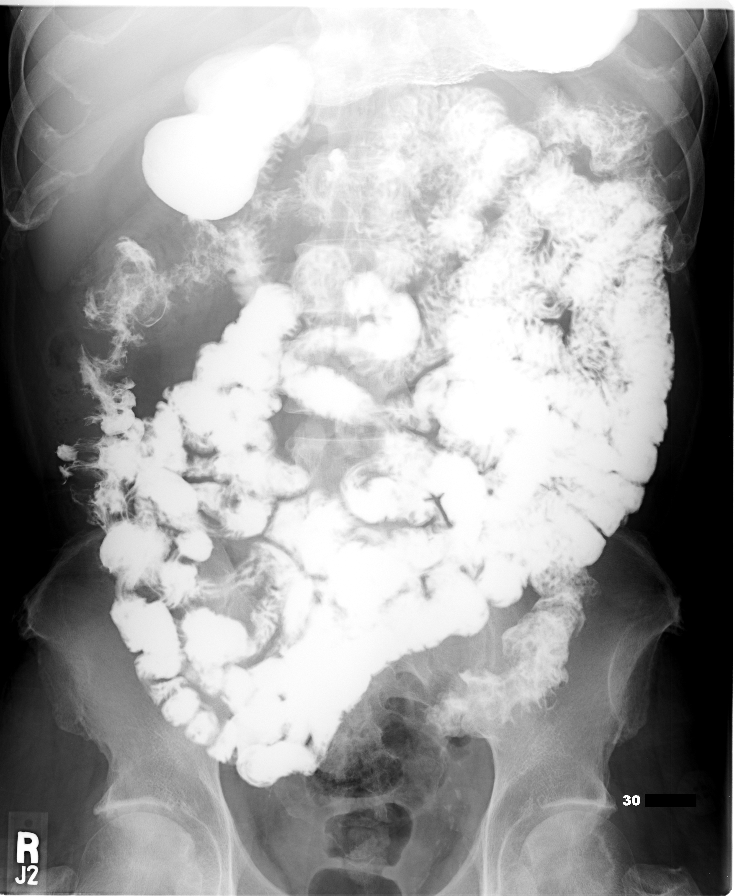

[3 of 3 positions shown; findings below may reference images not displayed]

FINDINGS: Normal bowel gas pattern on scout image.
No acute osseous abnormalities of urinary tract calcification.
No gastric outlet obstruction.
Ileal and jejunal small bowel loops are normal appearance with
normal wall thickness and mucosal fold pattern.
No wall thickening, mass, stricture, or dilatation.
Appendix visualized.
At lateral aspect of ascending colon, a large irregular filling
defect is identified with slightly lobulated/micronodular margins,
highly suspicious for a colonic malignancy.
This failed to resolve with compression or with placing the patient
in a right lateral decubitus position.
IMPRESSION: Unremarkable appearance of small bowel loops.
Filling defect at ascending colon suspicious for malignancy though
a fixed/adherent stool ball could less likely cause a similar
appearance; recommend colonoscopy.
Findings discussed with Dr. Quirijn at time of interpretation.

## 2012-08-07 ENCOUNTER — Encounter (HOSPITAL_COMMUNITY): Payer: Self-pay | Admitting: Emergency Medicine

## 2012-08-07 ENCOUNTER — Observation Stay (HOSPITAL_COMMUNITY)
Admission: EM | Admit: 2012-08-07 | Discharge: 2012-08-09 | Disposition: A | Discharge status: Home / Self Care | Payer: Medicaid Other | Attending: Internal Medicine | Admitting: Internal Medicine

## 2012-08-07 ENCOUNTER — Emergency Department (HOSPITAL_COMMUNITY)
Admission: EM | Admit: 2012-08-07 | Discharge: 2012-08-07 | Discharge status: Against Medical Advice | Payer: Medicaid Other

## 2012-08-07 DIAGNOSIS — R198 Other specified symptoms and signs involving the digestive system and abdomen: Secondary | ICD-10-CM

## 2012-08-07 DIAGNOSIS — I10 Essential (primary) hypertension: Secondary | ICD-10-CM | POA: Diagnosis present

## 2012-08-07 DIAGNOSIS — I129 Hypertensive chronic kidney disease with stage 1 through stage 4 chronic kidney disease, or unspecified chronic kidney disease: Principal | ICD-10-CM | POA: Insufficient documentation

## 2012-08-07 DIAGNOSIS — I6350 Cerebral infarction due to unspecified occlusion or stenosis of unspecified cerebral artery: Secondary | ICD-10-CM

## 2012-08-07 DIAGNOSIS — Q279 Congenital malformation of peripheral vascular system, unspecified: Secondary | ICD-10-CM

## 2012-08-07 DIAGNOSIS — E162 Hypoglycemia, unspecified: Secondary | ICD-10-CM | POA: Diagnosis present

## 2012-08-07 DIAGNOSIS — K922 Gastrointestinal hemorrhage, unspecified: Secondary | ICD-10-CM

## 2012-08-07 DIAGNOSIS — N289 Disorder of kidney and ureter, unspecified: Secondary | ICD-10-CM

## 2012-08-07 DIAGNOSIS — Z720 Tobacco use: Secondary | ICD-10-CM | POA: Diagnosis present

## 2012-08-07 DIAGNOSIS — G40909 Epilepsy, unspecified, not intractable, without status epilepticus: Secondary | ICD-10-CM | POA: Insufficient documentation

## 2012-08-07 DIAGNOSIS — I252 Old myocardial infarction: Secondary | ICD-10-CM

## 2012-08-07 DIAGNOSIS — E785 Hyperlipidemia, unspecified: Secondary | ICD-10-CM | POA: Diagnosis present

## 2012-08-07 DIAGNOSIS — Z8673 Personal history of transient ischemic attack (TIA), and cerebral infarction without residual deficits: Secondary | ICD-10-CM | POA: Insufficient documentation

## 2012-08-07 DIAGNOSIS — D509 Iron deficiency anemia, unspecified: Secondary | ICD-10-CM | POA: Diagnosis present

## 2012-08-07 DIAGNOSIS — F1210 Cannabis abuse, uncomplicated: Secondary | ICD-10-CM

## 2012-08-07 DIAGNOSIS — E119 Type 2 diabetes mellitus without complications: Secondary | ICD-10-CM | POA: Diagnosis present

## 2012-08-07 DIAGNOSIS — R569 Unspecified convulsions: Secondary | ICD-10-CM | POA: Diagnosis present

## 2012-08-07 DIAGNOSIS — N179 Acute kidney failure, unspecified: Secondary | ICD-10-CM

## 2012-08-07 DIAGNOSIS — M25819 Other specified joint disorders, unspecified shoulder: Secondary | ICD-10-CM

## 2012-08-07 DIAGNOSIS — N189 Chronic kidney disease, unspecified: Secondary | ICD-10-CM | POA: Diagnosis present

## 2012-08-07 DIAGNOSIS — Z8679 Personal history of other diseases of the circulatory system: Secondary | ICD-10-CM

## 2012-08-07 DIAGNOSIS — F17200 Nicotine dependence, unspecified, uncomplicated: Secondary | ICD-10-CM

## 2012-08-07 HISTORY — DX: Type 2 diabetes mellitus without complications: E11.9

## 2012-08-07 HISTORY — DX: Essential (primary) hypertension: I10

## 2012-08-07 LAB — GLUCOSE, CAPILLARY: Glucose-Capillary: 141 mg/dL — ABNORMAL HIGH (ref 70–99)

## 2012-08-07 NOTE — ED Notes (Signed)
EMS called out for altered mental status, CBG was 34 on arrival, EMS gave 1 amp of d50w and cbg 168 at this time per EMS. Pt is now conscious and aox4. Pt also hypertensive. Pt has not been eating today. Takes oral antidiabetic

## 2012-08-07 NOTE — ED Provider Notes (Signed)
History    This chart was scribed for Mark Crease, MD, by Frederik Pear, ED scribe. The patient was seen in room APA01/APA01 and the patient's care was started at 2220.    CSN: [id number]  Arrival date & time 08/07/12  2215   First MD Initiated Contact with Patient 08/07/12 2220      Chief Complaint  Patient presents with  . Hypoglycemia    (Consider location/radiation/quality/duration/timing/severity/associated sxs/prior treatment) The history is provided by the patient and medical records. No language interpreter was used.    ERASMUS BISTLINE is a 62 y.o. male brought in by ambulance with a h/o of DM and hypertension who presents to the Emergency Department complaining of sudden onset, rapidly improving hypoglycemia that is alleviated by [facility] and began PTA. EMS reports that they were called out for AMS, and his CBG was 34. He was given 1 amp D50 IV and the repeat CBG was 168. He denies cough, nausea, emesis, diarrhea, seizure, or fever. He reports that he takes glimepiride, losartan, phenytoin, and omeprazole daily and denies any recent changes to his medications. He states that he took all of his daily medications as prescribed.  Past Medical History  Diagnosis Date  . Renal disorder   . Diabetes mellitus without complication   . Hypertension     History reviewed. No pertinent past surgical history.  No family history on file.  History  Substance Use Topics  . Smoking status: Current Every Day Smoker  . Smokeless tobacco: Not on file  . Alcohol Use: Yes      Review of Systems  Constitutional: Negative for fever.  Cardiovascular:       Hypoglycemia  Gastrointestinal: Negative for nausea, vomiting and diarrhea.  All other systems reviewed and are negative.    Allergies  Review of patient's allergies indicates no known allergies.  Home Medications   Current Outpatient Rx  Name  Route  Sig  Dispense  Refill  . EXPIRED: omeprazole (PRILOSEC) 20 MG  capsule   Oral   Take 1 capsule (20 mg total) by mouth daily.   30 capsule   11     BP 128/82  Pulse 68  Temp(Src) 97.6 F (36.4 C) (Oral)  Resp 18  Wt 158 lb (71.668 kg)  BMI 22.67 kg/m2  SpO2 100%  Physical Exam  Nursing note and vitals reviewed. Constitutional: He is oriented to person, place, and time. He appears well-developed and well-nourished. No distress.  HENT:  Head: Normocephalic and atraumatic.  Right Ear: Hearing normal.  Nose: Nose normal.  Mouth/Throat: Oropharynx is clear and moist and mucous membranes are normal.  Eyes: Conjunctivae and EOM are normal. Pupils are equal, round, and reactive to light.  Neck: Normal range of motion. Neck supple.  Cardiovascular: Normal rate, regular rhythm, S1 normal and S2 normal.  Exam reveals no gallop and no friction rub.   No murmur heard. Pulmonary/Chest: Effort normal and breath sounds normal. No respiratory distress. He exhibits no tenderness.  Abdominal: Soft. Normal appearance and bowel sounds are normal. There is no hepatosplenomegaly. There is no tenderness. There is no rebound, no guarding, no tenderness at McBurney's point and negative Murphy's sign. No hernia.  Musculoskeletal: Normal range of motion.  Neurological: He is alert and oriented to person, place, and time. He has normal strength. No cranial nerve deficit or sensory deficit. Coordination normal. GCS eye subscore is 4. GCS verbal subscore is 5. GCS motor subscore is 6.  Skin: Skin is warm, dry  and intact. No rash noted. No cyanosis.  Psychiatric: He has a normal mood and affect. His speech is normal and behavior is normal. Thought content normal.    ED Course  Procedures (including critical care time)  DIAGNOSTIC STUDIES: Oxygen Saturation is 100% on room air, normal by my interpretation.    COORDINATION OF CARE:  22:15- Discussed planned course of treatment with the patient, including rechecking his CBG levels after a meal, who is agreeable at this  time.   Labs Reviewed  GLUCOSE, CAPILLARY - Abnormal; Notable for the following:    Glucose-Capillary 141 (*)    All other components within normal limits   No results found.   Diagnosis: Hypoglycemia    MDM  She comes to the ER for evaluation of hypoglycemia. Patient's blood sugar was 34. He was given oral glucose and an amp of D50. He has eaten a meal here and his glucose has been stable. Patient admits that he did not eat today, has not had any symptoms that would suggest infection or other causes of hypoglycemia. Patient stable for discharge.  I personally performed the services described in this documentation, which was scribed in my presence. The recorded information has been reviewed and is accurate.       Mark Crease, MD 08/08/12 [phone]

## 2012-08-07 NOTE — ED Notes (Signed)
Patient presents to ER via [facility] after second EMS visit today for low blood sugar.  EMS states patient's blood sugar was 34; patient was given 1 amp D50 IV by EMS staff and repeat CBG was 168 prior to arrival.  Patient A&O; skin w/d. Respirations even and unlabored; able to speak in complete sentences without difficulty.

## 2012-08-07 NOTE — ED Notes (Signed)
CBG 141 on arrival via our CBG

## 2012-08-08 DIAGNOSIS — E119 Type 2 diabetes mellitus without complications: Secondary | ICD-10-CM

## 2012-08-08 DIAGNOSIS — I10 Essential (primary) hypertension: Secondary | ICD-10-CM

## 2012-08-08 DIAGNOSIS — N189 Chronic kidney disease, unspecified: Secondary | ICD-10-CM

## 2012-08-08 DIAGNOSIS — Z720 Tobacco use: Secondary | ICD-10-CM | POA: Diagnosis present

## 2012-08-08 DIAGNOSIS — E162 Hypoglycemia, unspecified: Secondary | ICD-10-CM

## 2012-08-08 DIAGNOSIS — N179 Acute kidney failure, unspecified: Secondary | ICD-10-CM | POA: Diagnosis present

## 2012-08-08 LAB — LIPID PANEL
HDL: 107 mg/dL (ref >39)
LDL Cholesterol: 37 mg/dL (ref 0–99)
Total CHOL/HDL Ratio: 1.5 RATIO
Triglycerides: 70 mg/dL (ref <150)

## 2012-08-08 LAB — CBC WITH DIFFERENTIAL/PLATELET
Eosinophils Absolute: 0.1 10*3/uL (ref 0.0–0.7)
Eosinophils Relative: 2 % (ref 0–5)
HCT: 29.3 % — ABNORMAL LOW (ref 39.0–52.0)
Hemoglobin: 10.4 g/dL — ABNORMAL LOW (ref 13.0–17.0)
Lymphs Abs: 1.2 10*3/uL (ref 0.7–4.0)
MCH: 33.9 pg (ref 26.0–34.0)
MCV: 95.4 fL (ref 78.0–100.0)
Monocytes Relative: 11 % (ref 3–12)
RBC: 3.07 MIL/uL — ABNORMAL LOW (ref 4.22–5.81)

## 2012-08-08 LAB — GLUCOSE, CAPILLARY
Glucose-Capillary: 133 mg/dL — ABNORMAL HIGH (ref 70–99)
Glucose-Capillary: 210 mg/dL — ABNORMAL HIGH (ref 70–99)
Glucose-Capillary: 35 mg/dL — CL (ref 70–99)
Glucose-Capillary: 81 mg/dL (ref 70–99)

## 2012-08-08 LAB — BASIC METABOLIC PANEL
CO2: 25 mEq/L (ref 19–32)
Chloride: 109 mEq/L (ref 96–112)
Glucose, Bld: 164 mg/dL — ABNORMAL HIGH (ref 70–99)
Potassium: 4.1 mEq/L (ref 3.5–5.1)
Sodium: 142 mEq/L (ref 135–145)

## 2012-08-08 LAB — HEMOGLOBIN A1C: Mean Plasma Glucose: 114 mg/dL (ref <117)

## 2012-08-08 MED ORDER — HYDRALAZINE HCL 20 MG/ML IJ SOLN
5.0000 mg | Freq: Four times a day (QID) | INTRAMUSCULAR | Status: DC | PRN
  Administered 2012-08-08: 5 mg via INTRAVENOUS
  Filled 2012-08-08: qty 1

## 2012-08-08 MED ORDER — ONDANSETRON HCL 4 MG PO TABS: 4.0000 mg | ORAL_TABLET | Freq: Four times a day (QID) | ORAL | Status: DC | PRN

## 2012-08-08 MED ORDER — SODIUM CHLORIDE 0.9 % IV SOLN
INTRAVENOUS | Status: AC
  Administered 2012-08-08: 12:00:00 via INTRAVENOUS

## 2012-08-08 MED ORDER — METOPROLOL TARTRATE 25 MG PO TABS
25.0000 mg | ORAL_TABLET | Freq: Two times a day (BID) | ORAL | Status: DC
Start: 2012-08-08 — End: 2012-08-09
  Administered 2012-08-08 – 2012-08-09 (×3): 25 mg via ORAL
  Filled 2012-08-08 (×3): qty 1

## 2012-08-08 MED ORDER — ONDANSETRON HCL 4 MG/2ML IJ SOLN: 4.0000 mg | Freq: Four times a day (QID) | INTRAMUSCULAR | Status: DC | PRN

## 2012-08-08 MED ORDER — PNEUMOCOCCAL VAC POLYVALENT 25 MCG/0.5ML IJ INJ
0.5000 mL | INJECTION | INTRAMUSCULAR | Status: AC
Start: 2012-08-09 — End: 2012-08-09
  Administered 2012-08-09: 0.5 mL via INTRAMUSCULAR
  Filled 2012-08-08: qty 0.5

## 2012-08-08 MED ORDER — DEXTROSE 50 % IV SOLN
50.0000 mL | Freq: Once | INTRAVENOUS | Status: AC
  Administered 2012-08-08: 50 mL via INTRAVENOUS

## 2012-08-08 MED ORDER — ACETAMINOPHEN 650 MG RE SUPP: 650.0000 mg | Freq: Four times a day (QID) | RECTAL | Status: DC | PRN

## 2012-08-08 MED ORDER — ENOXAPARIN SODIUM 40 MG/0.4ML ~~LOC~~ SOLN
40.0000 mg | Freq: Every day | SUBCUTANEOUS | Status: DC
  Administered 2012-08-08 – 2012-08-09 (×2): 40 mg via SUBCUTANEOUS
  Filled 2012-08-08 (×2): qty 0.4

## 2012-08-08 MED ORDER — INSULIN ASPART 100 UNIT/ML ~~LOC~~ SOLN
0.0000 [IU] | Freq: Three times a day (TID) | SUBCUTANEOUS | Status: DC
  Administered 2012-08-08: 3 [IU] via SUBCUTANEOUS
  Administered 2012-08-09: 1 [IU] via SUBCUTANEOUS

## 2012-08-08 MED ORDER — HYDROCODONE-ACETAMINOPHEN 5-325 MG PO TABS: 1.0000 | ORAL_TABLET | ORAL | Status: DC | PRN

## 2012-08-08 MED ORDER — DEXTROSE 50 % IV SOLN
INTRAVENOUS | Status: AC
  Filled 2012-08-08: qty 50

## 2012-08-08 MED ORDER — ALUM & MAG HYDROXIDE-SIMETH 200-200-20 MG/5ML PO SUSP: 30.0000 mL | Freq: Four times a day (QID) | ORAL | Status: DC | PRN

## 2012-08-08 MED ORDER — SIMVASTATIN 10 MG PO TABS
10.0000 mg | ORAL_TABLET | Freq: Every day | ORAL | Status: DC
  Administered 2012-08-08: 10 mg via ORAL
  Filled 2012-08-08: qty 1

## 2012-08-08 MED ORDER — ACETAMINOPHEN 325 MG PO TABS: 650.0000 mg | ORAL_TABLET | Freq: Four times a day (QID) | ORAL | Status: DC | PRN

## 2012-08-08 MED ORDER — PHENYTOIN SODIUM EXTENDED 100 MG PO CAPS
200.0000 mg | ORAL_CAPSULE | Freq: Every day | ORAL | Status: DC
  Administered 2012-08-08: 200 mg via ORAL
  Filled 2012-08-08: qty 2

## 2012-08-08 NOTE — H&P (Signed)
[facility] History and Physical  Mark Sanford HQI:[id number] DOB: 05/11/1951 DOA: 08/07/2012  Referring physician: Preston Fleeting PCP: Vertis Kelch, NP  Specialists: health dept  Chief Complaint: low blood sugar  HPI: Mark Sanford is a very pleasant 62 y.o. male with past medical hx that includes DM, CVA 2003, HTN, seizure disorder presented to ED 08/07/12 via EMS for CBG of 35. Information obtained from patient and girlfriend by phone. Girlfriend stated she checks pt's CBG every Sunday am. Yesterday reading was 35. Pt ate and later in day reading was 41. Girlfriend stated pt "stopped talking". She denied any obvious lethargy, fever, chills, recent illness. Pt denies headache visual changes. He reports light tingling left arm since his stroke in 2003. Denies abdominal pain/nausea, diarrhea, dysuria, hematuria, melena. Pt reports his "sugar runs low every now and then". He also reports that [facility] has referred him to a "kidney doctor". Pt unsure of when his appointment was. In ED pt given amp D50 and blood sugar 168. Pt had to wait for a ride (12 hours) and during that time CBG dropped to 35 again in ED. Bmet yields creatinine 1.9. Chart review indicates range (2 years ago) 1.1-1.2. Symptoms came on suddenly have persisted characterized as moderate to severe. [facility] asked to admit.    Review of Systems: The patient denies anorexia, fever, weight loss,, vision loss, decreased hearing, hoarseness, chest pain, syncope, dyspnea on exertion, peripheral edema, balance deficits, hemoptysis, abdominal pain, melena, hematochezia, severe indigestion/heartburn, hematuria, incontinence, genital sores, muscle weakness, suspicious skin lesions, transient blindness, difficulty walking, depression, unusual weight change, abnormal bleeding, enlarged lymph nodes, angioedema, and breast masses.    Past Medical History  Diagnosis Date  . Renal disorder   . Diabetes mellitus without complication   .  Hypertension    History reviewed. No pertinent past surgical history. Social History:  reports that he has been smoking.  He does not have any smokeless tobacco history on file. He reports that he has not had ETOD in 5 months. He reports that he uses illicit drugs (Marijuana). Pt lives with girlfriend. Is disabled former Corporate investment banker.  No Known Allergies  No family history on file. Mother deceased 44's from renal failure. Father deceased unknown medical hx. 15 siblings, 14 deceased. Collective medical hx + for DM, HTN, renal failure, stroke, MI Prior to Admission medications   Medication Sig Start Date End Date Taking? Authorizing Provider  glimepiride (AMARYL) 4 MG tablet Take 4 mg by mouth daily.   Yes Historical Provider, MD  losartan (COZAAR) 100 MG tablet Take 100 mg by mouth daily.   Yes Historical Provider, MD  metoprolol tartrate (LOPRESSOR) 25 MG tablet Take 25 mg by mouth 2 (two) times daily.   Yes Historical Provider, MD  phenytoin (DILANTIN) 100 MG ER capsule Take 200 mg by mouth at bedtime.   Yes Historical Provider, MD  simvastatin (ZOCOR) 10 MG tablet Take 10 mg by mouth at bedtime.   Yes Historical Provider, MD   Physical Exam: Filed Vitals:   08/07/12 2218 08/08/12 0009 08/08/12 0619 08/08/12 0905  BP: 128/82 187/81 187/80 181/84  Pulse: 68 68 68 66  Temp: 97.6 F (36.4 C)  97.9 F (36.6 C) 98.2 F (36.8 C)  TempSrc: Oral  Oral Oral  Resp: 18 18 18 18   Weight: 71.668 kg (158 lb)     SpO2: 100% 100% 98% 100%     General:  Well nourished, alert NAD  Eyes: PERRL EOMI no  scleral icterus  ENT: ears clear, nose without drainage, oropharynx without exudate. Mucus membranes pink/moist. Dentures intacte  Neck: supple no lymphadenopathy, full rom  Cardiovascular: RRR no MGR No LE edema  Respiratory: normal effort BS distant but clear to auscultation bilaterally no wheeze no rhonchi  Abdomen: flat soft +BS non-tender to palpation  Skin: warm dry no  rash  Musculoskeletal: MAE no clubbing no cyanosis.   Psychiatric: calm cooperative  Neurologic: alert and oriented x3. Left grip 4/5 right grip 5/5 LE strength 5/5 bilaterally.   Labs on Admission:  Basic Metabolic Panel:  Recent Labs Lab 08/08/12 0737  NA 142  K 4.1  CL 109  CO2 25  GLUCOSE 164*  BUN 17  CREATININE 1.94*  CALCIUM 8.1*   Liver Function Tests: No results found for this basename: AST, ALT, ALKPHOS, BILITOT, PROT, ALBUMIN,  in the last 168 hours No results found for this basename: LIPASE, AMYLASE,  in the last 168 hours No results found for this basename: AMMONIA,  in the last 168 hours CBC:  Recent Labs Lab 08/08/12 0737  WBC 6.3  NEUTROABS 4.2  HGB 10.4*  HCT 29.3*  MCV 95.4  PLT 250   Cardiac Enzymes: No results found for this basename: CKTOTAL, CKMB, CKMBINDEX, TROPONINI,  in the last 168 hours  BNP (last 3 results) No results found for this basename: PROBNP,  in the last 8760 hours CBG:  Recent Labs Lab 08/07/12 2218 08/08/12 0006 08/08/12 0625 08/08/12 0903  GLUCAP 141* 210* 35* 133*    Radiological Exams on Admission: No results found.  EKG: none ordered  Assessment/Plan Principal Problem:   Acute on chronic renal failure: Likely related to HTN and DM. Pt reports recent referral to renal MD so also possibility of worsening CKD..  Will admit to med surg. Chart review indicates 2 years ago creatinine range 1.2. Today 1.9. Will hold nephrotoxins and give IV fluids. Will recheck bmet.  Active Problems:  Hypoglycemia: likely related to #1 and amaryl. Will hold amaryl. Will place on SSI and monitor closely. Will check HgA1c. Will likely need to adjust meds due to #1.     DM w/o complication type II: See #2.     HYPERLIPIDEMIA: on statin. Will check lipid panel.     ANEMIA, IRON DEFICIENCY, CHRONIC: likely of chronic disease. Chart review indicates drop since 12/11. Will likely need op workup. No s/sx bleeding.     HYPERTENSION:  on ARB and BB. Will hold ARB due to #1. . Will provide prn hydralazine as needed. Monitor.     SEIZURE DISORDER: Pt states no seizure in 9 years and MD weaning off of dilantin.     CEREBROVASCULAR ACCIDENT, HX OF: 2003. Continue statin. No deficits except left arm tingling.   Tobacco abuse: counseling for cessation offered. Pt no interested in quitting at this time.      Code Status: full Family Communication: pt and girlfriend by phone Disposition Plan: likely home in am.   Time spent: 65 minutes  Gwenyth Bender [facility] Pager [phone]  If 7PM-7AM, please contact night-coverage [email or web address] Password [facility] 08/08/2012, 9:47 AM Attending Patient seen and examined. Above-noted reviewed. In this patient, who likely has chronic kidney disease, Amaryl would not be the drug of choice because of its long half life and he also admitted that he does not eat 3 meals a day. When I asked him why, he told me that he is somehow bored with eating the same food every day. Agree with  intravenous fluids and discontinuation of Amaryl. Agree with holding ARB for the time being. We do not really know where his baseline creatinine is at the present time. He does look somewhat clinically dehydrated. If his hemoglobin A1c is not extremely high, I would choose glipizide as an oral agent on discharge. If his hemoglobin A1c is very high, insulin would be a consideration .

## 2012-08-08 NOTE — ED Notes (Signed)
Pt has contacted a ride, is waiting to hear back from family at this time

## 2012-08-08 NOTE — ED Notes (Signed)
Pt states wife/family will be here this morning to pick him up. Pt states he does not mind waiting in the lobby until family arrives. Pt will be given snack/juice then discharged.

## 2012-08-08 NOTE — ED Notes (Signed)
Called to give report, nurse to call me back

## 2012-08-08 NOTE — ED Notes (Signed)
Pt states that no one is able to give him a ride as the car is out of gas, states he does not have anyone else he can call for a ride

## 2012-08-08 NOTE — Progress Notes (Signed)
UR Chart Review Completed  

## 2012-08-08 NOTE — ED Provider Notes (Signed)
Patient had presented with hypoglycemia and was watched in the emergency department overnight and had recurrence of hypoglycemia. Worrisome is that he had a sugar as low as 35 without any symptoms. Laboratory workup showed mild renal insufficiency. Renal insufficiency plus taking of any plain and metoprolol may exacerbate hypoglycemia from self-injurious. He will need to be observed for recurrent hypoglycemia and will probably need his dose of glimepiride reduced. Case is discussed with Dr. Karilyn Cota [facility] who agrees to admit him under observation status.  Results for orders placed during the hospital encounter of 08/07/12  GLUCOSE, CAPILLARY      Result Value Range   Glucose-Capillary 141 (*) 70 - 99 mg/dL  GLUCOSE, CAPILLARY      Result Value Range   Glucose-Capillary 210 (*) 70 - 99 mg/dL  GLUCOSE, CAPILLARY      Result Value Range   Glucose-Capillary 35 (*) 70 - 99 mg/dL   Comment 1 Notify RN    BASIC METABOLIC PANEL      Result Value Range   Sodium 142  135 - 145 mEq/L   Potassium 4.1  3.5 - 5.1 mEq/L   Chloride 109  96 - 112 mEq/L   CO2 25  19 - 32 mEq/L   Glucose, Bld 164 (*) 70 - 99 mg/dL   BUN 17  6 - 23 mg/dL   Creatinine, Ser 1.61 (*) 0.50 - 1.35 mg/dL   Calcium 8.1 (*) 8.4 - 10.5 mg/dL   GFR calc non Af Amer 36 (*) >90 mL/min   GFR calc Af Amer 42 (*) >90 mL/min  CBC WITH DIFFERENTIAL      Result Value Range   WBC 6.3  4.0 - 10.5 K/uL   RBC 3.07 (*) 4.22 - 5.81 MIL/uL   Hemoglobin 10.4 (*) 13.0 - 17.0 g/dL   HCT 09.6 (*) 04.5 - 40.9 %   MCV 95.4  78.0 - 100.0 fL   MCH 33.9  26.0 - 34.0 pg   MCHC 35.5  30.0 - 36.0 g/dL   RDW 81.1  91.4 - 78.2 %   Platelets 250  150 - 400 K/uL   Neutrophils Relative 67  43 - 77 %   Neutro Abs 4.2  1.7 - 7.7 K/uL   Lymphocytes Relative 20  12 - 46 %   Lymphs Abs 1.2  0.7 - 4.0 K/uL   Monocytes Relative 11  3 - 12 %   Monocytes Absolute 0.7  0.1 - 1.0 K/uL   Eosinophils Relative 2  0 - 5 %   Eosinophils Absolute 0.1   0.0 - 0.7 K/uL   Basophils Relative 0  0 - 1 %   Basophils Absolute 0.0  0.0 - 0.1 K/uL      Dione Booze, MD 08/08/12 [phone]

## 2012-08-08 NOTE — ED Notes (Signed)
cbg 35 prior to pt being discharged. Dr Laure Kidney notified and ordered 1amp d50 and for pt to get meal tray. Pt is aox4 and speaking clearly and concisely. NAD noted at this time.

## 2012-08-08 NOTE — ED Provider Notes (Signed)
6:58 AM Patient awaiting ride overnight. His blood sugar was checked again this morning and found to be 35 though he was awake and alert. He was given one amp of D50 IV and a morning meal tray was ordered.  Mark Seamen, MD 08/08/12 [phone]

## 2012-08-09 LAB — BASIC METABOLIC PANEL
BUN: 20 mg/dL (ref 6–23)
BUN: 20 mg/dL (ref 6–23)
Calcium: 8.2 mg/dL — ABNORMAL LOW (ref 8.4–10.5)
Calcium: 7.9 mg/dL — ABNORMAL LOW (ref 8.4–10.5)
Chloride: 109 mEq/L (ref 96–112)
Creatinine, Ser: 2.12 mg/dL — ABNORMAL HIGH (ref 0.50–1.35)
GFR calc Af Amer: 38 mL/min — ABNORMAL LOW (ref >90)
GFR calc Af Amer: 32 mL/min — ABNORMAL LOW (ref >90)
GFR calc non Af Amer: 27 mL/min — ABNORMAL LOW (ref >90)
Glucose, Bld: 88 mg/dL (ref 70–99)
Potassium: 4.7 mEq/L (ref 3.5–5.1)
Sodium: 139 mEq/L (ref 135–145)

## 2012-08-09 LAB — URINALYSIS, ROUTINE W REFLEX MICROSCOPIC
Bilirubin Urine: NEGATIVE
Glucose, UA: NEGATIVE mg/dL
Ketones, ur: NEGATIVE mg/dL
Leukocytes, UA: NEGATIVE
Specific Gravity, Urine: 1.025 (ref 1.005–1.030)
pH: 6 (ref 5.0–8.0)

## 2012-08-09 LAB — URINE MICROSCOPIC-ADD ON

## 2012-08-09 LAB — GLUCOSE, CAPILLARY: Glucose-Capillary: 87 mg/dL (ref 70–99)

## 2012-08-09 MED ORDER — AMLODIPINE BESYLATE 5 MG PO TABS
5.0000 mg | ORAL_TABLET | Freq: Every day | ORAL | Status: DC
  Administered 2012-08-09: 5 mg via ORAL
  Filled 2012-08-09: qty 1

## 2012-08-09 MED ORDER — GLIPIZIDE 5 MG PO TABS
2.5000 mg | ORAL_TABLET | Freq: Every day | ORAL | Status: DC
  Administered 2012-08-09: 2.5 mg via ORAL
  Filled 2012-08-09: qty 1

## 2012-08-09 MED ORDER — SODIUM CHLORIDE 0.9 % IV SOLN
INTRAVENOUS | Status: DC
  Administered 2012-08-09: 09:00:00 via INTRAVENOUS

## 2012-08-09 MED ORDER — GLIPIZIDE 2.5 MG HALF TABLET: 2.5000 mg | ORAL_TABLET | Freq: Every day | ORAL | Status: DC

## 2012-08-09 MED ORDER — AMLODIPINE BESYLATE 5 MG PO TABS: 5.0000 mg | ORAL_TABLET | Freq: Every day | ORAL | Status: DC

## 2012-08-09 NOTE — Progress Notes (Signed)
Patient given d/c instructions and prescriptions. IV cath removed and intact. No pain or swelling at site.

## 2012-08-09 NOTE — Discharge Summary (Signed)
Physician Discharge Summary  Mark Sanford ZOX:[id number] DOB: 05/11/1951 DOA: 08/07/2012  PCP: Vertis Kelch, NP  Admit date: 08/07/2012 Discharge date: 08/09/2012  Time spent: 40 minutes   Recommendations for Outpatient Follow-up:  [facility]. Recommend bmet to trend creatinine. Also recommend monitoring of BP as meds adjusted. Evaluate CBG reading as diabetes meds changed.  2. Dr. Kristian Covey on 09/12/12 for lab work and 09/14/12 with MD  Discharge Diagnoses:  Principal Problem:   Acute on chronic renal failure Active Problems:   DM w/o complication type II   HYPERLIPIDEMIA   ANEMIA, IRON DEFICIENCY, CHRONIC   HYPERTENSION   SEIZURE DISORDER   CEREBROVASCULAR ACCIDENT, HX OF   Hypoglycemia   Tobacco abuse   Discharge Condition: stable  Diet recommendation: carb modified  Filed Weights   08/07/12 2218 08/08/12 1302  Weight: 71.668 kg (158 lb) 65.046 kg (143 lb 6.4 oz)    History of present illness:  Mark Sanford is a very pleasant 62 y.o. male with past medical hx that includes DM, CVA 2003, HTN, seizure disorder presented to ED 08/07/12 via EMS for CBG of 35. Information obtained from patient and girlfriend by phone. Girlfriend stated she checks pt's CBG every Sunday am. On 08/07/12 reading was 35. Pt ate and later in day reading was 41. Girlfriend stated pt "stopped talking". She denied any obvious lethargy, fever, chills, recent illness. Pt denied headache visual changes. He reported light tingling left arm since his stroke in 2003. Denied abdominal pain/nausea, diarrhea, dysuria, hematuria, melena. Pt reported his "sugar runs low every now and then". He also reported that [facility] referred him to a "kidney doctor". Pt unsure of when his appointment was. In ED pt given amp D50 and blood sugar 168. Pt had to wait for a ride home as plan was to discharge (12 hours) and during that time CBG dropped to 35 again in ED. Bmet yields creatinine  1.9. Chart review indicates range (2 years ago) 1.1-1.2. Symptoms came on suddenly have persisted characterized as moderate to severe. TR[facility] asked to admit.   Hospital Course:  Acute on chronic renal failure: Likely related to HTN and DM. Pt reported recent referral to renal MD so also possibility of worsening CKD. On admission creatinine 1.9. Trended upward. Amaryl stopped and ARB stopped. Renal ultra sound done last year was normal. Pt with follow up appointment with [facility] on 09/14/12. Will follow up with PCP 1 week for bmet to trend renal function.  Active Problems:  Hypoglycemia: likely related to #1 and amaryl. Amaryl discontinued. Pt also admitted to not eating 3 meals daily. HgA1c 5.6.  Low dose glypizide added. Follow up with PCP 1 week to evaluate glycemic control.   DM w/o complication type II: See #2.   HYPERLIPIDEMIA: on statin. Lipid panel within normal limits.   ANEMIA, IRON DEFICIENCY, CHRONIC: likely of chronic disease. Chart review indicates drop since 12/11. Will likely need op workup. No s/sx bleeding.   HYPERTENSION: on ARB and BB. ARB discontinued due to #1. BP trended upward during hospitalization. Norvasc added. Control improved. Recommend BP evaluation 1 week with PCP.   SEIZURE DISORDER: Pt states no seizure in 9 years and MD weaning off of dilantin.   CEREBROVASCULAR ACCIDENT, HX OF: 2003. Continue statin. No deficits except left arm tingling.   Tobacco abuse: counseling for cessation offered. Pt no interested in quitting at this time.    Procedures: none Consultations:  none  Discharge Exam: Filed Vitals:   08/08/12 1302 08/08/12 1403 08/08/12 2222 08/09/12 0458  BP:  194/86 212/90 192/87  Pulse:  63 67 62  Temp:  98.1 F (36.7 C)  98.1 F (36.7 C)  TempSrc:    Oral  Resp:  18 16 20   Height:      Weight: 65.046 kg (143 lb 6.4 oz)     SpO2:  99% 96% 99%    General: awake alert smiling Cardiovascular: RRR No MGR No LE edema Respiratory: normal  effort BS clear bilaterally no wheeze  Discharge Instructions     Medication List    STOP taking these medications       glimepiride 4 MG tablet  Commonly known as:  AMARYL     losartan 100 MG tablet  Commonly known as:  COZAAR      TAKE these medications       amLODipine 5 MG tablet  Commonly known as:  NORVASC  Take 1 tablet (5 mg total) by mouth daily.     glipiZIDE 2.5 mg Tabs  Commonly known as:  GLUCOTROL  Take 0.5 tablets (2.5 mg total) by mouth daily before breakfast.     metoprolol tartrate 25 MG tablet  Commonly known as:  LOPRESSOR  Take 25 mg by mouth 2 (two) times daily.     phenytoin 100 MG ER capsule  Commonly known as:  DILANTIN  Take 200 mg by mouth at bedtime.     simvastatin 10 MG tablet  Commonly known as:  ZOCOR  Take 10 mg by mouth at bedtime.           Follow-up Information   Follow up with Vertis Kelch, NP.      Follow up with [facility], MD On 09/12/2012. (9am for blood work. and 6.4/14 at 12:15 to see MD.)    Contact information:   [address] [place] [address] [phone]        The results of significant diagnostics from this hospitalization (including imaging, microbiology, ancillary and laboratory) are listed below for reference.    Significant Diagnostic Studies: No results found.      Labs: Basic Metabolic Panel:  Recent Labs Lab 08/08/12 0737 08/09/12 0535  NA 142 141  K 4.1 4.0  CL 109 109  CO2 25 26  GLUCOSE 164* 103*  BUN 17 20  CREATININE 1.94* 2.12*  CALCIUM 8.1* 8.2*      CBC:  Recent Labs Lab 08/08/12 0737  WBC 6.3  NEUTROABS 4.2  HGB 10.4*  HCT 29.3*  MCV 95.4  PLT 250     BNP: BNP (last 3 results)  CBG:  Recent Labs Lab 08/08/12 0903 08/08/12 1149 08/08/12 1619 08/08/12 2148 08/09/12 0745  GLUCAP 133* 81 244* 133* 87       Signed:  BLACK,KAREN M  [facility] 08/09/2012, 10:53 AM Attending: Patient seen and examined. Patient is  medically stable for discharge. He will followup with nephrology. I have told the patient to stop taking Amaryl. He has been given a prescription for glipizide, which is shorter acting, and he will take this for his diabetes.

## 2012-11-23 ENCOUNTER — Emergency Department (HOSPITAL_COMMUNITY)
Admission: EM | Admit: 2012-11-23 | Discharge: 2012-11-23 | Disposition: A | Discharge status: Home / Self Care | Payer: Medicaid Other | Attending: Emergency Medicine | Admitting: Emergency Medicine

## 2012-11-23 ENCOUNTER — Encounter (HOSPITAL_COMMUNITY): Payer: Self-pay | Admitting: Emergency Medicine

## 2012-11-23 DIAGNOSIS — Z79899 Other long term (current) drug therapy: Secondary | ICD-10-CM | POA: Insufficient documentation

## 2012-11-23 DIAGNOSIS — F17200 Nicotine dependence, unspecified, uncomplicated: Secondary | ICD-10-CM | POA: Insufficient documentation

## 2012-11-23 DIAGNOSIS — E1169 Type 2 diabetes mellitus with other specified complication: Secondary | ICD-10-CM | POA: Insufficient documentation

## 2012-11-23 DIAGNOSIS — Z87448 Personal history of other diseases of urinary system: Secondary | ICD-10-CM | POA: Insufficient documentation

## 2012-11-23 DIAGNOSIS — E162 Hypoglycemia, unspecified: Secondary | ICD-10-CM

## 2012-11-23 DIAGNOSIS — I10 Essential (primary) hypertension: Secondary | ICD-10-CM | POA: Insufficient documentation

## 2012-11-23 HISTORY — DX: Cerebral infarction, unspecified: I63.9

## 2012-11-23 HISTORY — DX: Disorder of kidney and ureter, unspecified: N28.9

## 2012-11-23 HISTORY — DX: Anemia, unspecified: D64.9

## 2012-11-23 HISTORY — DX: Unspecified convulsions: R56.9

## 2012-11-23 LAB — CBC WITH DIFFERENTIAL/PLATELET
Basophils Absolute: 0 10*3/uL (ref 0.0–0.1)
HCT: 28.1 % — ABNORMAL LOW (ref 39.0–52.0)
Lymphocytes Relative: 13 % (ref 12–46)
Neutro Abs: 4.7 10*3/uL (ref 1.7–7.7)
Neutrophils Relative %: 78 % — ABNORMAL HIGH (ref 43–77)
Platelets: 208 10*3/uL (ref 150–400)
RDW: 12.9 % (ref 11.5–15.5)
WBC: 6.1 10*3/uL (ref 4.0–10.5)

## 2012-11-23 LAB — GLUCOSE, CAPILLARY
Glucose-Capillary: 32 mg/dL — CL (ref 70–99)
Glucose-Capillary: 171 mg/dL — ABNORMAL HIGH (ref 70–99)
Glucose-Capillary: 171 mg/dL — ABNORMAL HIGH (ref 70–99)
Glucose-Capillary: 183 mg/dL — ABNORMAL HIGH (ref 70–99)

## 2012-11-23 LAB — BASIC METABOLIC PANEL
CO2: 24 mEq/L (ref 19–32)
Chloride: 104 mEq/L (ref 96–112)
GFR calc Af Amer: 34 mL/min — ABNORMAL LOW (ref >90)
Potassium: 3.6 mEq/L (ref 3.5–5.1)
Sodium: 137 mEq/L (ref 135–145)

## 2012-11-23 LAB — PHENYTOIN LEVEL, TOTAL: Phenytoin Lvl: <2.5 ug/mL — ABNORMAL LOW (ref 10.0–20.0)

## 2012-11-23 MED ORDER — DEXTROSE 50 % IV SOLN
INTRAVENOUS | Status: AC
  Administered 2012-11-23: 50 mL via INTRAVENOUS
  Filled 2012-11-23: qty 50

## 2012-11-23 MED ORDER — DEXTROSE 50 % IV SOLN: 50.0000 mL | Freq: Once | INTRAVENOUS | Status: AC

## 2012-11-23 NOTE — ED Notes (Signed)
[facility] pharmacy consulted about glipizide Rx. Pharmacist states patient takes regular glipizide and not the XR.

## 2012-11-23 NOTE — ED Notes (Signed)
Patient ambulatory to restroom. Steady gait.  

## 2012-11-23 NOTE — ED Notes (Signed)
Patient alert/oriented x 4 now. No distress. No complaints. Breakfast tray ordered.

## 2012-11-23 NOTE — ED Provider Notes (Signed)
CSN: [id number]     Arrival date & time 11/23/12  0857 History     First MD Initiated Contact with Patient 11/23/12 [phone]     Chief Complaint  Patient presents with  . Altered Mental Status    Patient is a 62 y.o. male presenting with altered mental status. The history is provided by the spouse. The history is limited by the condition of the patient (AMS).  Altered Mental Status  Pt was seen at 0900. Per pt's wife, c/o sudden onset and persistence of constant AMS that began PTA. Pt's wife states she was waiting for an outpatient radiology study when pt "just started not acting right" and "got confused." She states he took his PO DM meds this morning approx 0600/0630 but has not eaten today. Last meal yesterday. No LOC, no syncope, no seizure. Pt himself denies any specific complaints.     Past Medical History  Diagnosis Date  . Diabetes mellitus without complication   . Hypertension   . Renal insufficiency    History reviewed. No pertinent past surgical history.  History  Substance Use Topics  . Smoking status: Current Every Day Smoker  . Smokeless tobacco: Not on file  . Alcohol Use: Yes    Review of Systems  Unable to perform ROS: Mental status change     Allergies  Review of patient's allergies indicates no known allergies.  Home Medications   Current Outpatient Rx  Name  Route  Sig  Dispense  Refill  . amLODipine (NORVASC) 10 MG tablet   Oral   Take 10 mg by mouth daily.         Marland Kitchen glipiZIDE (GLUCOTROL) 5 MG tablet   Oral   Take 5 mg by mouth daily.         . metoprolol tartrate (LOPRESSOR) 25 MG tablet   Oral   Take 25 mg by mouth 2 (two) times daily.         . phenytoin (DILANTIN) 100 MG ER capsule   Oral   Take 400 mg by mouth at bedtime.          . simvastatin (ZOCOR) 10 MG tablet   Oral   Take 10 mg by mouth at bedtime.          BP 159/61  Pulse 61  Temp(Src) 97.7 F (36.5 C)  Resp 18  Ht 5\' 6"  (1.676 m)  Wt 130 lb (58.968 kg)   BMI 20.99 kg/m2  SpO2 100% Physical Exam 0905: Physical examination:  Nursing notes reviewed; Vital signs and O2 SAT reviewed;  Constitutional: Well developed, Well nourished, Well hydrated, In no acute distress; Head:  Normocephalic, atraumatic; Eyes: EOMI, PERRL, No scleral icterus; ENMT: Mouth and pharynx normal, Mucous membranes moist; Neck: Supple, Full range of motion, No lymphadenopathy; Cardiovascular: Regular rate and rhythm, No murmur, rub, or gallop; Respiratory: Breath sounds clear & equal bilaterally, No rales, rhonchi, wheezes.  Speaking full sentences with ease, Normal respiratory effort/excursion; Chest: Nontender, Movement normal; Abdomen: Soft, Nontender, Nondistended, Normal bowel sounds; Genitourinary: No CVA tenderness; Extremities: Pulses normal, No tenderness, No edema, No calf edema or asymmetry.; Neuro: Awake, alert, confused regarding events. No facial droop. Major CN grossly intact.  Speech clear. Moves all extremities spontaneously without apparent gross focal motor deficits.; Skin: Color normal, Warm, Dry.   ED Course   0915:  Pt presents with AMS that began while his wife was waiting for an outpatient radiology study.  States he did not eat today but took  his DM meds. CBG 32. IV D50 given with immediate improvement of pt back to his baseline. Pt denies any complaints.  Meal tray ordered.   1500:  Pt has maintained his glucose levels while in the ED after extended observation period. Pt has eaten 2 meals without N/V, ambulated in the ED with steady gait, easy resps. VS remain stable. CBG remains stable. Pt continues to act per his baseline. He does not want to stay in the ED or hospital any longer and wants to go home "right now."  Pt will not take any DM meds today or tomorrow, check his CBG tomorrow morning and call his PMD with results and to receive further instructions regarding his DM meds.  Pt verb understanding.  Dx and testing d/w pt and family.  Questions answered.   Verb understanding, agreeable to d/c home with outpt f/u.    Procedures   MDM  MDM Reviewed: previous chart, nursing note and vitals Interpretation: labs   Results for orders placed during the hospital encounter of 11/23/12  GLUCOSE, CAPILLARY      Result Value Range   Glucose-Capillary 32 (*) 70 - 99 mg/dL  GLUCOSE, CAPILLARY      Result Value Range   Glucose-Capillary 171 (*) 70 - 99 mg/dL  CBC WITH DIFFERENTIAL      Result Value Range   WBC 6.1  4.0 - 10.5 K/uL   RBC 2.91 (*) 4.22 - 5.81 MIL/uL   Hemoglobin 9.9 (*) 13.0 - 17.0 g/dL   HCT 19.1 (*) 47.8 - 29.5 %   MCV 96.6  78.0 - 100.0 fL   MCH 34.0  26.0 - 34.0 pg   MCHC 35.2  30.0 - 36.0 g/dL   RDW 62.1  30.8 - 65.7 %   Platelets 208  150 - 400 K/uL   Neutrophils Relative % 78 (*) 43 - 77 %   Neutro Abs 4.7  1.7 - 7.7 K/uL   Lymphocytes Relative 13  12 - 46 %   Lymphs Abs 0.8  0.7 - 4.0 K/uL   Monocytes Relative 8  3 - 12 %   Monocytes Absolute 0.5  0.1 - 1.0 K/uL   Eosinophils Relative 1  0 - 5 %   Eosinophils Absolute 0.1  0.0 - 0.7 K/uL   Basophils Relative 0  0 - 1 %   Basophils Absolute 0.0  0.0 - 0.1 K/uL  BASIC METABOLIC PANEL      Result Value Range   Sodium 137  135 - 145 mEq/L   Potassium 3.6  3.5 - 5.1 mEq/L   Chloride 104  96 - 112 mEq/L   CO2 24  19 - 32 mEq/L   Glucose, Bld 181 (*) 70 - 99 mg/dL   BUN 22  6 - 23 mg/dL   Creatinine, Ser 8.46 (*) 0.50 - 1.35 mg/dL   Calcium 7.9 (*) 8.4 - 10.5 mg/dL   GFR calc non Af Amer 29 (*) >90 mL/min   GFR calc Af Amer 34 (*) >90 mL/min  PHENYTOIN LEVEL, TOTAL      Result Value Range   Phenytoin Lvl <2.5 (*) 10.0 - 20.0 ug/mL  GLUCOSE, CAPILLARY      Result Value Range   Glucose-Capillary 171 (*) 70 - 99 mg/dL   Comment 1 Documented in Chart    GLUCOSE, CAPILLARY      Result Value Range   Glucose-Capillary 183 (*) 70 - 99 mg/dL    Results for NELDON, SHEPARD (MRN  [id number]) as of 11/23/2012 15:20  Ref. Range 08/08/2012 07:37 08/09/2012 05:35 08/09/2012  14:24 11/23/2012 10:46  BUN Latest Range: 6-23 mg/dL 17 20 20 22   Creatinine Latest Range: 0.50-1.35 mg/dL 4.09 (H) 8.11 (H) 9.14 (H) 2.30 (H)   Results for AHMAN, DUGDALE (MRN [id number]) as of 11/23/2012 15:20  Ref. Range 11/27/2009 09:42 01/28/2010 12:00 03/28/2010 08:40 08/08/2012 07:37 11/23/2012 10:46  Hemoglobin Latest Range: 13.0-17.0 g/dL 08.6 (L) 57.8 (L) 46.9 (L) 10.4 (L) 9.9 (L)  HCT Latest Range: 39.0-52.0 % 35.8 (L) 36.7 (L) 35.1 (L) 29.3 (L) 28.1 (L)     Laray Anger, DO 11/26/12 2209

## 2012-11-23 NOTE — ED Notes (Signed)
Patient arrived via waiting room. Wife reports she was here for outpatient ultrasound. Patient found confused, oriented to person only. Able to follow some simple commands.

## 2012-11-23 NOTE — ED Notes (Signed)
Patient informed of plan of care. Patient agreeable to eat lunch in ED with follow up CBG. Denies any needs at present. Meal tray ordered.

## 2012-11-23 NOTE — ED Notes (Signed)
Patient provided lunch tray.

## 2012-11-23 NOTE — ED Notes (Signed)
Blood glucose 32. MD at bedside and aware. Verbal order for 1 amp d50w obtained.

## 2013-06-13 ENCOUNTER — Other Ambulatory Visit: Payer: Self-pay | Admitting: *Deleted

## 2013-06-13 DIAGNOSIS — Z01810 Encounter for preprocedural cardiovascular examination: Secondary | ICD-10-CM

## 2013-06-13 DIAGNOSIS — N184 Chronic kidney disease, stage 4 (severe): Secondary | ICD-10-CM

## 2013-06-14 ENCOUNTER — Encounter (HOSPITAL_COMMUNITY): Payer: Self-pay

## 2013-06-14 ENCOUNTER — Encounter (HOSPITAL_COMMUNITY)
Admission: RE | Admit: 2013-06-14 | Discharge: 2013-06-14 | Disposition: A | Discharge status: Home / Self Care | Payer: Medicaid Other | Source: Ambulatory Visit | Attending: Nephrology | Admitting: Nephrology

## 2013-06-14 DIAGNOSIS — D631 Anemia in chronic kidney disease: Secondary | ICD-10-CM | POA: Insufficient documentation

## 2013-06-14 DIAGNOSIS — N039 Chronic nephritic syndrome with unspecified morphologic changes: Secondary | ICD-10-CM

## 2013-06-14 DIAGNOSIS — N183 Chronic kidney disease, stage 3 unspecified: Secondary | ICD-10-CM | POA: Insufficient documentation

## 2013-06-14 LAB — HEMOGLOBIN AND HEMATOCRIT, BLOOD
HEMATOCRIT: 25.1 % — AB (ref 39.0–52.0)
Hemoglobin: 8.9 g/dL — ABNORMAL LOW (ref 13.0–17.0)

## 2013-06-14 MED ORDER — EPOETIN ALFA 10000 UNIT/ML IJ SOLN
INTRAMUSCULAR | Status: AC
Start: 2013-06-14 — End: 2013-06-14
  Filled 2013-06-14: qty 1

## 2013-06-14 MED ORDER — EPOETIN ALFA 10000 UNIT/ML IJ SOLN
2000.0000 [IU] | INTRAMUSCULAR | Status: DC
  Administered 2013-06-14: 2000 [IU] via SUBCUTANEOUS

## 2013-06-14 NOTE — Discharge Instructions (Signed)
Epoetin Alfa injection °What is this medicine? °EPOETIN ALFA (e POE e tin AL fa) helps your body make more red blood cells. This medicine is used to treat anemia caused by chronic kidney failure, cancer chemotherapy, or HIV-therapy. It may also be used before surgery if you have anemia. °This medicine may be used for other purposes; ask your health care provider or pharmacist if you have questions. °COMMON BRAND NAME(S): Epogen, Procrit °What should I tell my health care provider before I take this medicine? °They need to know if you have any of these conditions: °-blood clotting disorders °-cancer patient not on chemotherapy °-cystic fibrosis °-heart disease, such as angina or heart failure °-hemoglobin level of 12 g/dL or greater °-high blood pressure °-low levels of folate, iron, or vitamin B12 °-seizures °-an unusual or allergic reaction to erythropoietin, albumin, benzyl alcohol, hamster proteins, other medicines, foods, dyes, or preservatives °-pregnant or trying to get pregnant °-breast-feeding °How should I use this medicine? °This medicine is for injection into a vein or under the skin. It is usually given by a health care professional in a hospital or clinic setting. °If you get this medicine at home, you will be taught how to prepare and give this medicine. Use exactly as directed. Take your medicine at regular intervals. Do not take your medicine more often than directed. °It is important that you put your used needles and syringes in a special sharps container. Do not put them in a trash can. If you do not have a sharps container, call your pharmacist or healthcare provider to get one. °Talk to your pediatrician regarding the use of this medicine in children. While this drug may be prescribed for selected conditions, precautions do apply. °Overdosage: If you think you have taken too much of this medicine contact a poison control center or emergency room at once. °NOTE: This medicine is only for you. Do  not share this medicine with others. °What if I miss a dose? °If you miss a dose, take it as soon as you can. If it is almost time for your next dose, take only that dose. Do not take double or extra doses. °What may interact with this medicine? °Do not take this medicine with any of the following medications: °-darbepoetin alfa °This list may not describe all possible interactions. Give your health care provider a list of all the medicines, herbs, non-prescription drugs, or dietary supplements you use. Also tell them if you smoke, drink alcohol, or use illegal drugs. Some items may interact with your medicine. °What should I watch for while using this medicine? °Visit your prescriber or health care professional for regular checks on your progress and for the needed blood tests and blood pressure measurements. It is especially important for the doctor to make sure your hemoglobin level is in the desired range, to limit the risk of potential side effects and to give you the best benefit. Keep all appointments for any recommended tests. Check your blood pressure as directed. Ask your doctor what your blood pressure should be and when you should contact him or her. °As your body makes more red blood cells, you may need to take iron, folic acid, or vitamin B supplements. Ask your doctor or health care provider which products are right for you. If you have kidney disease continue dietary restrictions, even though this medication can make you feel better. Talk with your doctor or health care professional about the foods you eat and the vitamins that you take. °What   side effects may I notice from receiving this medicine? Side effects that you should report to your doctor or health care professional as soon as possible: -allergic reactions like skin rash, itching or hives, swelling of the face, lips, or tongue -breathing problems -changes in vision -chest pain -confusion, trouble speaking or understanding -feeling  faint or lightheaded, falls -high blood pressure -muscle aches or pains -pain, swelling, warmth in the leg -rapid weight gain -severe headaches -sudden numbness or weakness of the face, arm or leg -trouble walking, dizziness, loss of balance or coordination -seizures (convulsions) -swelling of the ankles, feet, hands -unusually weak or tired Side effects that usually do not require medical attention (report to your doctor or health care professional if they continue or are bothersome): -diarrhea -fever, chills (flu-like symptoms) -headaches -nausea, vomiting -redness, stinging, or swelling at site where injected This list may not describe all possible side effects. Call your doctor for medical advice about side effects. You may report side effects to FDA at 1-800-FDA-1088. Where should I keep my medicine? Keep out of the reach of children. Store in a refrigerator between 2 and 8 degrees C (36 and 46 degrees F). Do not freeze or shake. Throw away any unused portion if using a single-dose vial. Multi-dose vials can be kept in the refrigerator for up to 21 days after the initial dose. Throw away unused medicine. NOTE: This sheet is a summary. It may not cover all possible information. If you have questions about this medicine, talk to your doctor, pharmacist, or health care provider.  2014, Elsevier/Gold Standard. (2008-03-13 10:25:44)  Next  appointment for blood check is March 18th at 1000.

## 2013-06-14 NOTE — Progress Notes (Signed)
Results for DELFORD, WINGERT (MRN [id number]) as of 06/14/2013 10:49  Procrit 2000 units SQ given as indicated. Tolerated well Next appointment 06/28/2013 @ 1000   Ref. Range 11/23/2012 13:55 06/14/2013 10:04  Hemoglobin Latest Range: 13.0-17.0 g/dL  8.9 (L)  HCT Latest Range: 39.0-52.0 %  25.1 (L)

## 2013-06-28 ENCOUNTER — Encounter (HOSPITAL_COMMUNITY)
Admission: RE | Admit: 2013-06-28 | Discharge: 2013-06-28 | Disposition: A | Discharge status: Home / Self Care | Payer: Medicaid Other | Source: Ambulatory Visit | Attending: Nephrology | Admitting: Nephrology

## 2013-06-28 ENCOUNTER — Encounter (HOSPITAL_COMMUNITY): Payer: Self-pay

## 2013-06-28 LAB — RENAL FUNCTION PANEL
ALBUMIN: 2 g/dL — AB (ref 3.5–5.2)
BUN: 38 mg/dL — AB (ref 6–23)
CO2: 27 mEq/L (ref 19–32)
Calcium: 8 mg/dL — ABNORMAL LOW (ref 8.4–10.5)
Chloride: 103 mEq/L (ref 96–112)
Creatinine, Ser: 4.81 mg/dL — ABNORMAL HIGH (ref 0.50–1.35)
GFR calc Af Amer: 14 mL/min — ABNORMAL LOW (ref >90)
GFR, EST NON AFRICAN AMERICAN: 12 mL/min — AB (ref >90)
Glucose, Bld: 141 mg/dL — ABNORMAL HIGH (ref 70–99)
PHOSPHORUS: 4.2 mg/dL (ref 2.3–4.6)
POTASSIUM: 4 meq/L (ref 3.7–5.3)
SODIUM: 142 meq/L (ref 137–147)

## 2013-06-28 LAB — HEMOGLOBIN AND HEMATOCRIT, BLOOD
HEMATOCRIT: 27.8 % — AB (ref 39.0–52.0)
HEMOGLOBIN: 9.7 g/dL — AB (ref 13.0–17.0)

## 2013-06-28 MED ORDER — EPOETIN ALFA 10000 UNIT/ML IJ SOLN
2000.0000 [IU] | INTRAMUSCULAR | Status: DC
  Administered 2013-06-28: 2000 [IU] via SUBCUTANEOUS
  Filled 2013-06-28: qty 1

## 2013-06-28 NOTE — Progress Notes (Signed)
Results for Mark Sanford, Mark Sanford (MRN [id number]) as of 06/28/2013 16:12  Ref. Range 06/28/2013 09:59  Sodium Latest Range: 137-147 mEq/L 142  Potassium Latest Range: 3.7-5.3 mEq/L 4.0  Chloride Latest Range: 96-112 mEq/L 103  CO2 Latest Range: 19-32 mEq/L 27  BUN Latest Range: 6-23 mg/dL 38 (H)  Creatinine Latest Range: 0.50-1.35 mg/dL 4.81 (H)  Calcium Latest Range: 8.4-10.5 mg/dL 8.0 (L)  GFR calc non Af Amer Latest Range: >90 mL/min 12 (L)  GFR calc Af Amer Latest Range: >90 mL/min 14 (L)  Glucose Latest Range: 70-99 mg/dL 141 (H)  Phosphorus Latest Range: 2.3-4.6 mg/dL 4.2  Albumin Latest Range: 3.5-5.2 g/dL 2.0 (L)  Hemoglobin Latest Range: 13.0-17.0 g/dL 9.7 (L)  HCT Latest Range: 39.0-52.0 % 27.8 (L)

## 2013-07-03 ENCOUNTER — Encounter: Payer: Self-pay | Admitting: Vascular Surgery

## 2013-07-04 ENCOUNTER — Inpatient Hospital Stay (HOSPITAL_COMMUNITY): Admission: RE | Admit: 2013-07-04 | Payer: Medicaid Other | Source: Ambulatory Visit

## 2013-07-04 ENCOUNTER — Ambulatory Visit: Payer: Medicaid Other | Admitting: Vascular Surgery

## 2013-07-12 ENCOUNTER — Encounter (HOSPITAL_COMMUNITY)
Admission: RE | Admit: 2013-07-12 | Discharge: 2013-07-12 | Disposition: A | Discharge status: Home / Self Care | Payer: Medicaid Other | Source: Ambulatory Visit | Attending: Nephrology | Admitting: Nephrology

## 2013-07-12 DIAGNOSIS — N183 Chronic kidney disease, stage 3 unspecified: Secondary | ICD-10-CM | POA: Insufficient documentation

## 2013-07-12 DIAGNOSIS — D631 Anemia in chronic kidney disease: Secondary | ICD-10-CM | POA: Insufficient documentation

## 2013-07-12 DIAGNOSIS — N039 Chronic nephritic syndrome with unspecified morphologic changes: Secondary | ICD-10-CM

## 2013-07-12 LAB — HEMOGLOBIN AND HEMATOCRIT, BLOOD
HCT: 28.2 % — ABNORMAL LOW (ref 39.0–52.0)
HEMOGLOBIN: 9.9 g/dL — AB (ref 13.0–17.0)

## 2013-07-12 MED ORDER — EPOETIN ALFA 10000 UNIT/ML IJ SOLN
2000.0000 [IU] | Freq: Once | INTRAMUSCULAR | Status: AC
  Administered 2013-07-12: 2000 [IU] via SUBCUTANEOUS
  Filled 2013-07-12: qty 1

## 2013-07-12 NOTE — Progress Notes (Signed)
Results for BERKELEY, VANAKEN (MRN [id number]) as of 07/12/2013 10:45  Ref. Range 07/12/2013 10:00  Hemoglobin Latest Range: 13.0-17.0 g/dL 9.9 (L)  HCT Latest Range: 39.0-52.0 % 28.2 (L)  Procrit 2000 units given subq left abdomen.

## 2013-07-17 ENCOUNTER — Encounter: Payer: Self-pay | Admitting: Vascular Surgery

## 2013-07-18 ENCOUNTER — Other Ambulatory Visit (HOSPITAL_COMMUNITY): Payer: Medicaid Other

## 2013-07-18 ENCOUNTER — Encounter (HOSPITAL_COMMUNITY): Payer: Medicaid Other

## 2013-07-18 ENCOUNTER — Ambulatory Visit: Payer: Medicaid Other | Admitting: Vascular Surgery

## 2013-07-26 ENCOUNTER — Encounter (HOSPITAL_COMMUNITY)
Admission: RE | Admit: 2013-07-26 | Discharge: 2013-07-26 | Disposition: A | Discharge status: Home / Self Care | Payer: Medicaid Other | Source: Ambulatory Visit | Attending: Nephrology | Admitting: Nephrology

## 2013-07-26 LAB — HEMOGLOBIN AND HEMATOCRIT, BLOOD
HCT: 25.5 % — ABNORMAL LOW (ref 39.0–52.0)
Hemoglobin: 9 g/dL — ABNORMAL LOW (ref 13.0–17.0)

## 2013-07-26 MED ORDER — EPOETIN ALFA 2000 UNIT/ML IJ SOLN
2000.0000 [IU] | Freq: Once | INTRAMUSCULAR | Status: DC
  Administered 2013-07-26: 2000 [IU] via SUBCUTANEOUS
  Filled 2013-07-26: qty 1

## 2013-07-26 MED ORDER — EPOETIN ALFA 2000 UNIT/ML IJ SOLN
2000.0000 [IU] | Freq: Once | INTRAMUSCULAR | Status: DC
  Filled 2013-07-26: qty 1

## 2013-07-26 MED ORDER — EPOETIN ALFA 10000 UNIT/ML IJ SOLN
INTRAMUSCULAR | Status: AC
  Filled 2013-07-26: qty 1

## 2013-07-26 NOTE — Progress Notes (Signed)
Results for GAD, AYMOND (MRN [id number]) as of 07/26/2013 10:17  Ref. Range 07/26/2013 09:35  Hemoglobin Latest Range: 13.0-17.0 g/dL 9.0 (L)  HCT Latest Range: 39.0-52.0 % 25.5 (L)  Procrit 2000 units given subq.

## 2013-08-02 ENCOUNTER — Encounter: Payer: Self-pay | Admitting: Vascular Surgery

## 2013-08-03 ENCOUNTER — Ambulatory Visit (INDEPENDENT_AMBULATORY_CARE_PROVIDER_SITE_OTHER)
Admission: RE | Admit: 2013-08-03 | Discharge: 2013-08-03 | Disposition: A | Discharge status: Home / Self Care | Payer: Medicaid Other | Source: Ambulatory Visit | Attending: Vascular Surgery | Admitting: Vascular Surgery

## 2013-08-03 ENCOUNTER — Encounter: Payer: Self-pay | Admitting: *Deleted

## 2013-08-03 ENCOUNTER — Other Ambulatory Visit: Payer: Self-pay | Admitting: *Deleted

## 2013-08-03 ENCOUNTER — Ambulatory Visit (HOSPITAL_COMMUNITY)
Admission: RE | Admit: 2013-08-03 | Discharge: 2013-08-03 | Disposition: A | Discharge status: Home / Self Care | Payer: Medicaid Other | Source: Ambulatory Visit | Attending: Vascular Surgery | Admitting: Vascular Surgery

## 2013-08-03 ENCOUNTER — Ambulatory Visit (INDEPENDENT_AMBULATORY_CARE_PROVIDER_SITE_OTHER): Payer: Medicaid Other | Admitting: Vascular Surgery

## 2013-08-03 ENCOUNTER — Encounter: Payer: Self-pay | Admitting: Vascular Surgery

## 2013-08-03 VITALS — BP 156/87 | HR 80 | Resp 14 | Ht 66.0 in | Wt 134.0 lb

## 2013-08-03 DIAGNOSIS — N184 Chronic kidney disease, stage 4 (severe): Secondary | ICD-10-CM

## 2013-08-03 DIAGNOSIS — Z01810 Encounter for preprocedural cardiovascular examination: Secondary | ICD-10-CM

## 2013-08-03 NOTE — Progress Notes (Signed)
Referred by:  Dionisio Paschal, NP No address on file  Reason for referral: New access  History of Present Illness  Mark Sanford is a 63 y.o. (05/11/1951) male who presents for evaluation for permanent access.  The patient is right hand dominant.  The patient has not had previous access procedures.  Previous central venous cannulation procedures include: none.  The patient has never had a PPM placed.  From patient's chart, he is chronic kidney disease stage IV-V.  Pt continues to urinate at this point.   Past Medical History  Diagnosis Date  . Diabetes mellitus without complication   . Hypertension   . Renal insufficiency   . Chronic anemia   . Stroke   . Seizures    Past Surgical History: none  History   Social History  . Marital Status: Single    Spouse Name: N/A    Number of Children: N/A  . Years of Education: N/A   Occupational History  . Not on file.   Social History Main Topics  . Smoking status: Former Smoker    Quit date: 02/02/2013  . Smokeless tobacco: Never Used  . Alcohol Use: Yes  . Drug Use: No  . Sexual Activity: Not on file   Other Topics Concern  . Not on file   Social History Narrative  . No narrative on file    Family History  Problem Relation Age of Onset  . Diabetes Mother   . Hypertension Mother   . Diabetes Father   . Hypertension Father     Current Outpatient Prescriptions on File Prior to Visit  Medication Sig Dispense Refill  . amLODipine (NORVASC) 10 MG tablet Take 10 mg by mouth daily.      Marland Kitchen glipiZIDE (GLUCOTROL) 5 MG tablet Take 5 mg by mouth daily.      . metoprolol tartrate (LOPRESSOR) 25 MG tablet Take 25 mg by mouth 2 (two) times daily.      . phenytoin (DILANTIN) 100 MG ER capsule Take 400 mg by mouth at bedtime.       . simvastatin (ZOCOR) 10 MG tablet Take 10 mg by mouth at bedtime.       No current facility-administered medications on file prior to visit.    No Known Allergies  REVIEW OF SYSTEMS:  (Positives  checked otherwise negative)  CARDIOVASCULAR:  []  chest pain, []  chest pressure, []  palpitations, []  shortness of breath when laying flat, []  shortness of breath with exertion,  []  pain in feet when walking, []  pain in feet when laying flat, []  history of blood clot in veins (DVT), []  history of phlebitis, []  swelling in legs, []  varicose veins  PULMONARY:  []  productive cough, []  asthma, []  wheezing  NEUROLOGIC:  []  weakness in arms or legs, []  numbness in arms or legs, []  difficulty speaking or slurred speech, []  temporary loss of vision in one eye, []  dizziness  HEMATOLOGIC:  []  bleeding problems, []  problems with blood clotting too easily  MUSCULOSKEL:  []  joint pain, []  joint swelling  GASTROINTEST:  []  vomiting blood, []  blood in stool     GENITOURINARY:  []  burning with urination, []  blood in urine  PSYCHIATRIC:  []  history of major depression  INTEGUMENTARY:  []  rashes, []  ulcers  CONSTITUTIONAL:  []  fever, []  chills  Physical Examination  Filed Vitals:   08/03/13 1637  BP: 156/87  Pulse: 80  Resp: 14  Height: 5\' 6"  (1.676 m)  Weight: 134 lb (60.782 kg)  Body mass index is 21.64 kg/(m^2).  General: A&O x 3, WD, thin  Head: [place]/AT  Ear/Nose/Throat: Hearing grossly intact, nares w/o erythema or drainage, oropharynx w/o Erythema/Exudate, Mallampati score: 3  Eyes: PERRLA, EOMI  Neck: Supple, no nuchal rigidity, no palpable LAD  Pulmonary: Sym exp, good air movt, CTAB, no rales, rhonchi, & wheezing  Cardiac: RRR, Nl S1, S2, no Murmurs, rubs or gallops  Vascular: Vessel Right Left  Radial Palpable Palpable  Ulnar Faintly Palpable Faintly Palpable  Brachial Palpable Palpable  Carotid Palpable, without bruit Palpable, without bruit  Aorta Not palpable N/A  Femoral Palpable Palpable  Popliteal Not palpable Not palpable  PT Not Palpable Not Palpable  DP Not Palpable Not Palpable   Gastrointestinal: soft, NTND, -G/R, - HSM, - masses, - CVAT  B  Musculoskeletal: M/S 5/5 throughout , Extremities without ischemic changes   Neurologic: CN 2-12 intact , Pain and light touch intact in extremities , Motor exam as listed above  Psychiatric: Judgment intact, Mood & affect appropriate for pt's clinical situation  Dermatologic: See M/S exam for extremity exam, no rashes otherwise noted  Lymph : No Cervical, Axillary, or Inguinal lymphadenopathy   Non-Invasive Vascular Imaging  Vein Mapping  (Date: 08/03/2013):   R arm: acceptable vein conduits include marginal upper arm basilic  L arm: acceptable vein conduits include marginal upper arm basilic  BUE Doppler (Date: 08/03/2013):   R arm:   Brachial: tri  Radial: tri  Ulnar: tri  L arm:   Brachial: tri   Radial: tri  Ulnar: tri  Outside Studies/Documentation 5 pages of outside documents were reviewed including: outside nephrology chart.  Medical Decision Making  Mark Sanford is a 63 y.o. male who presents with chronic kidney disease stage IV-V  Given this patient's poor conduit options, I feel B arm and central venography might help determine the best first fistula attempt.  The patient is aware of the risk of anaphylaxis and renal failure and agrees to proceed.  The above procedure which will be scheduled this Monday 27 APR 15.  Adele Barthel, MD [facility] Office: [phone] Pager: [phone]  08/03/2013, 5:07 PM

## 2013-08-04 ENCOUNTER — Encounter (HOSPITAL_COMMUNITY): Payer: Self-pay | Admitting: Pharmacy Technician

## 2013-08-07 ENCOUNTER — Ambulatory Visit (HOSPITAL_COMMUNITY)
Admission: RE | Admit: 2013-08-07 | Discharge: 2013-08-07 | Disposition: A | Discharge status: Home / Self Care | Payer: Medicaid Other | Source: Ambulatory Visit | Attending: Vascular Surgery | Admitting: Vascular Surgery

## 2013-08-07 ENCOUNTER — Encounter (HOSPITAL_COMMUNITY): Payer: Self-pay | Admitting: Pharmacy Technician

## 2013-08-07 ENCOUNTER — Encounter (HOSPITAL_COMMUNITY)
Admission: RE | Disposition: A | Discharge status: Home / Self Care | Payer: Self-pay | Source: Ambulatory Visit | Attending: Vascular Surgery

## 2013-08-07 DIAGNOSIS — N185 Chronic kidney disease, stage 5: Secondary | ICD-10-CM | POA: Insufficient documentation

## 2013-08-07 DIAGNOSIS — Z8673 Personal history of transient ischemic attack (TIA), and cerebral infarction without residual deficits: Secondary | ICD-10-CM | POA: Insufficient documentation

## 2013-08-07 DIAGNOSIS — R569 Unspecified convulsions: Secondary | ICD-10-CM | POA: Insufficient documentation

## 2013-08-07 DIAGNOSIS — N184 Chronic kidney disease, stage 4 (severe): Secondary | ICD-10-CM

## 2013-08-07 DIAGNOSIS — D649 Anemia, unspecified: Secondary | ICD-10-CM | POA: Insufficient documentation

## 2013-08-07 DIAGNOSIS — Z87891 Personal history of nicotine dependence: Secondary | ICD-10-CM | POA: Insufficient documentation

## 2013-08-07 DIAGNOSIS — E119 Type 2 diabetes mellitus without complications: Secondary | ICD-10-CM | POA: Insufficient documentation

## 2013-08-07 DIAGNOSIS — I120 Hypertensive chronic kidney disease with stage 5 chronic kidney disease or end stage renal disease: Secondary | ICD-10-CM | POA: Insufficient documentation

## 2013-08-07 HISTORY — PX: VENOGRAM: SHX5497

## 2013-08-07 LAB — POCT I-STAT, CHEM 8
BUN: 23 mg/dL (ref 6–23)
CHLORIDE: 104 meq/L (ref 96–112)
Calcium, Ion: 1.1 mmol/L — ABNORMAL LOW (ref 1.13–1.30)
Creatinine, Ser: 3.9 mg/dL — ABNORMAL HIGH (ref 0.50–1.35)
GLUCOSE: 99 mg/dL (ref 70–99)
HEMATOCRIT: 26 % — AB (ref 39.0–52.0)
Hemoglobin: 8.8 g/dL — ABNORMAL LOW (ref 13.0–17.0)
POTASSIUM: 4 meq/L (ref 3.7–5.3)
Sodium: 137 mEq/L (ref 137–147)
TCO2: 23 mmol/L (ref 0–100)

## 2013-08-07 LAB — GLUCOSE, CAPILLARY
Glucose-Capillary: 82 mg/dL (ref 70–99)
Glucose-Capillary: 94 mg/dL (ref 70–99)

## 2013-08-07 SURGERY — VENOGRAM
Anesthesia: LOCAL | Laterality: Bilateral

## 2013-08-07 MED ORDER — SODIUM CHLORIDE 0.9 % IV SOLN
INTRAVENOUS | Status: DC
  Administered 2013-08-07: 11:00:00 via INTRAVENOUS

## 2013-08-07 MED ORDER — ACETAMINOPHEN 325 MG PO TABS: 650.0000 mg | ORAL_TABLET | ORAL | Status: DC | PRN

## 2013-08-07 NOTE — Op Note (Signed)
    OPERATIVE NOTE   PROCEDURE: 1. left arm and central venogram   PRE-OPERATIVE DIAGNOSIS: end stage renal disease  POST-OPERATIVE DIAGNOSIS: same as above   SURGEON: Adele Barthel, MD  ANESTHESIA: local  ESTIMATED BLOOD LOSS: 5 cc  FINDING(S): 1. Small cephalic vein 2. Accepted brachial vein for brachial vein transposition  3. Patent high brachial vein and central venous structures  SPECIMEN(S):  None  CONTRAST: 30 cc  INDICATIONS: Mark Sanford is a 63 y.o. male who presents with chronic kidney disease stage V. The patient is scheduled for left venogram to help determine the availability of proximal veins for permanent access placement.  The patient is aware the risks include but are not limited to: bleeding, infection, thrombosis of the cannulated access, and possible anaphylactic reaction to the contrast.  The patient is aware of the risks of the procedure and elects to proceed forward.  DESCRIPTION: After full informed written consent was obtained, the patient was brought back to the angiography suite and placed supine upon the angiography table.  The patient was connected to monitoring equipment.  The left antecubital IV was connected to IV extension tubing.  Hand injections were completed to image the arm veins and central venous structures, the findings of which are listed above.    Based on the imaging, first access will be a stage brachial vein transposition.  COMPLICATIONS: none  CONDITION: stable  Adele Barthel, MD [facility] Office: [phone] Pager: [phone]  08/07/2013 11:38 AM

## 2013-08-07 NOTE — H&P (View-Only) (Signed)
  Referred by:  Debra Allen, NP No address on file  Reason for referral: New access  History of Present Illness  Mark Sanford is a 62 y.o. (05/11/1951) male who presents for evaluation for permanent access.  The patient is right hand dominant.  The patient has not had previous access procedures.  Previous central venous cannulation procedures include: none.  The patient has never had a PPM placed.  From patient's chart, he is chronic kidney disease stage IV-V.  Pt continues to urinate at this point.   Past Medical History  Diagnosis Date  . Diabetes mellitus without complication   . Hypertension   . Renal insufficiency   . Chronic anemia   . Stroke   . Seizures    Past Surgical History: none  History   Social History  . Marital Status: Single    Spouse Name: N/A    Number of Children: N/A  . Years of Education: N/A   Occupational History  . Not on file.   Social History Main Topics  . Smoking status: Former Smoker    Quit date: 02/02/2013  . Smokeless tobacco: Never Used  . Alcohol Use: Yes  . Drug Use: No  . Sexual Activity: Not on file   Other Topics Concern  . Not on file   Social History Narrative  . No narrative on file    Family History  Problem Relation Age of Onset  . Diabetes Mother   . Hypertension Mother   . Diabetes Father   . Hypertension Father     Current Outpatient Prescriptions on File Prior to Visit  Medication Sig Dispense Refill  . amLODipine (NORVASC) 10 MG tablet Take 10 mg by mouth daily.      . glipiZIDE (GLUCOTROL) 5 MG tablet Take 5 mg by mouth daily.      . metoprolol tartrate (LOPRESSOR) 25 MG tablet Take 25 mg by mouth 2 (two) times daily.      . phenytoin (DILANTIN) 100 MG ER capsule Take 400 mg by mouth at bedtime.       . simvastatin (ZOCOR) 10 MG tablet Take 10 mg by mouth at bedtime.       No current facility-administered medications on file prior to visit.    No Known Allergies  REVIEW OF SYSTEMS:  (Positives  checked otherwise negative)  CARDIOVASCULAR:  [] chest pain, [] chest pressure, [] palpitations, [] shortness of breath when laying flat, [] shortness of breath with exertion,  [] pain in feet when walking, [] pain in feet when laying flat, [] history of blood clot in veins (DVT), [] history of phlebitis, [] swelling in legs, [] varicose veins  PULMONARY:  [] productive cough, [] asthma, [] wheezing  NEUROLOGIC:  [] weakness in arms or legs, [] numbness in arms or legs, [] difficulty speaking or slurred speech, [] temporary loss of vision in one eye, [] dizziness  HEMATOLOGIC:  [] bleeding problems, [] problems with blood clotting too easily  MUSCULOSKEL:  [] joint pain, [] joint swelling  GASTROINTEST:  [] vomiting blood, [] blood in stool     GENITOURINARY:  [] burning with urination, [] blood in urine  PSYCHIATRIC:  [] history of major depression  INTEGUMENTARY:  [] rashes, [] ulcers  CONSTITUTIONAL:  [] fever, [] chills  Physical Examination  Filed Vitals:   08/03/13 1637  BP: 156/87  Pulse: 80  Resp: 14  Height: 5' 6" (1.676 m)  Weight: 134 lb (60.782 kg)     Body mass index is 21.64 kg/(m^2).  General: A&O x 3, WD, thin  Head: [place]/AT  Ear/Nose/Throat: Hearing grossly intact, nares w/o erythema or drainage, oropharynx w/o Erythema/Exudate, Mallampati score: 3  Eyes: PERRLA, EOMI  Neck: Supple, no nuchal rigidity, no palpable LAD  Pulmonary: Sym exp, good air movt, CTAB, no rales, rhonchi, & wheezing  Cardiac: RRR, Nl S1, S2, no Murmurs, rubs or gallops  Vascular: Vessel Right Left  Radial Palpable Palpable  Ulnar Faintly Palpable Faintly Palpable  Brachial Palpable Palpable  Carotid Palpable, without bruit Palpable, without bruit  Aorta Not palpable N/A  Femoral Palpable Palpable  Popliteal Not palpable Not palpable  PT Not Palpable Not Palpable  DP Not Palpable Not Palpable   Gastrointestinal: soft, NTND, -G/R, - HSM, - masses, - CVAT  B  Musculoskeletal: M/S 5/5 throughout , Extremities without ischemic changes   Neurologic: CN 2-12 intact , Pain and light touch intact in extremities , Motor exam as listed above  Psychiatric: Judgment intact, Mood & affect appropriate for pt's clinical situation  Dermatologic: See M/S exam for extremity exam, no rashes otherwise noted  Lymph : No Cervical, Axillary, or Inguinal lymphadenopathy   Non-Invasive Vascular Imaging  Vein Mapping  (Date: 08/03/2013):   R arm: acceptable vein conduits include marginal upper arm basilic  L arm: acceptable vein conduits include marginal upper arm basilic  BUE Doppler (Date: 08/03/2013):   R arm:   Brachial: tri  Radial: tri  Ulnar: tri  L arm:   Brachial: tri   Radial: tri  Ulnar: tri  Outside Studies/Documentation 5 pages of outside documents were reviewed including: outside nephrology chart.  Medical Decision Making  Mark Sanford is a 63 y.o. male who presents with chronic kidney disease stage IV-V  Given this patient's poor conduit options, I feel B arm and central venography might help determine the best first fistula attempt.  The patient is aware of the risk of anaphylaxis and renal failure and agrees to proceed.  The above procedure which will be scheduled this Monday 27 APR 15.  Adele Barthel, MD [facility] Office: [phone] Pager: [phone]  08/03/2013, 5:07 PM

## 2013-08-07 NOTE — Discharge Instructions (Signed)
Venogram, Care After ° °Refer to this sheet in the next few weeks. These instructions provide you with information on caring for yourself after your procedure. Your health care provider may also give you more specific instructions. Your treatment has been planned according to current medical practices, but problems sometimes occur. Call your health care provider if you have any problems or questions after your procedure. °WHAT TO EXPECT AFTER THE PROCEDURE °After your procedure, it is typical to have the following sensations: °· Mild discomfort at the catheter insertion site. °HOME CARE INSTRUCTIONS  °· Take all medicines exactly as directed. °· Follow any prescribed diet. °· Follow instructions regarding both rest and physical activity. °· Drink more fluids for the first several days after the procedure, in order to help flush dye from your kidneys. °SEEK MEDICAL CARE IF: °· You develop a rash. °SEEK IMMEDIATE MEDICAL CARE IF °· You have fever not controlled by medicine. °· There is pain, drainage, bleeding, redness, swelling, warmth or a red streak at the site of the IV tube. °· The extremity where your IV tube was placed becomes discolored, numb, or cool. °· You have difficulty breathing or shortness of breath. °· You develop chest pain. °· You have excessive dizziness or fainting. °Document Released: 01/18/2013 Document Reviewed: 12/05/2012 °Exit[employer]® Patient Information ©2014 [employer]. ° °

## 2013-08-07 NOTE — Interval H&P Note (Signed)
History and Physical Interval Note:  08/07/2013 10:14 AM  Mark Sanford  has presented today for surgery, with the diagnosis of swelling  The various methods of treatment have been discussed with the patient and family. After consideration of risks, benefits and other options for treatment, the patient has consented to  Procedure(s): VENOGRAM (Bilateral) as a surgical intervention .  The patient's history has been reviewed, patient examined, no change in status, stable for surgery.  I have reviewed the patient's chart and labs.  Questions were answered to the patient's satisfaction.     Conrad [place]

## 2013-08-08 ENCOUNTER — Encounter (HOSPITAL_COMMUNITY): Payer: Self-pay | Admitting: *Deleted

## 2013-08-08 ENCOUNTER — Other Ambulatory Visit: Payer: Self-pay

## 2013-08-08 MED ORDER — CHLORHEXIDINE GLUCONATE CLOTH 2 % EX PADS: 6.0000 | MEDICATED_PAD | Freq: Once | CUTANEOUS | Status: DC

## 2013-08-08 MED ORDER — DEXTROSE 5 % IV SOLN
1.5000 g | INTRAVENOUS | Status: AC
  Administered 2013-08-09: 1.5 g via INTRAVENOUS
  Filled 2013-08-08: qty 1.5

## 2013-08-08 NOTE — Progress Notes (Signed)
I could not be sure patient and his girlfriend (who helps with his medications) knew what medications to take in the am.  Patient knows to take a BP pill, but ask if it is the small one.  Mark Sanford said she has cataract and can not see to read the medication.  Mark Sanford, patient's is bring patient to the hospital, Mark Sanford gave me permission to call niece and tell her what medications to take and he would wait until she arrives.  I called and left a message on Terri's voice mail for patient to take Amlodipine and Metoprolol.

## 2013-08-09 ENCOUNTER — Ambulatory Visit (HOSPITAL_COMMUNITY)
Admission: RE | Admit: 2013-08-09 | Discharge: 2013-08-09 | Disposition: A | Discharge status: Home / Self Care | Payer: Medicaid Other | Source: Ambulatory Visit | Attending: Vascular Surgery | Admitting: Vascular Surgery

## 2013-08-09 ENCOUNTER — Inpatient Hospital Stay (HOSPITAL_COMMUNITY): Payer: Medicaid Other | Admitting: Certified Registered"

## 2013-08-09 ENCOUNTER — Encounter (HOSPITAL_COMMUNITY)
Admission: RE | Disposition: A | Discharge status: Home / Self Care | Payer: Self-pay | Source: Ambulatory Visit | Attending: Vascular Surgery

## 2013-08-09 ENCOUNTER — Inpatient Hospital Stay (HOSPITAL_COMMUNITY): Admission: RE | Admit: 2013-08-09 | Payer: Medicaid Other | Source: Ambulatory Visit

## 2013-08-09 ENCOUNTER — Other Ambulatory Visit: Payer: Self-pay | Admitting: *Deleted

## 2013-08-09 ENCOUNTER — Inpatient Hospital Stay (HOSPITAL_COMMUNITY): Payer: Medicaid Other

## 2013-08-09 ENCOUNTER — Encounter (HOSPITAL_COMMUNITY): Payer: Self-pay | Admitting: Certified Registered Nurse Anesthetist

## 2013-08-09 ENCOUNTER — Telehealth: Payer: Self-pay | Admitting: Vascular Surgery

## 2013-08-09 ENCOUNTER — Ambulatory Visit (HOSPITAL_COMMUNITY): Payer: Medicaid Other

## 2013-08-09 ENCOUNTER — Encounter (HOSPITAL_COMMUNITY): Payer: Medicaid Other | Admitting: Certified Registered"

## 2013-08-09 DIAGNOSIS — Z87891 Personal history of nicotine dependence: Secondary | ICD-10-CM | POA: Insufficient documentation

## 2013-08-09 DIAGNOSIS — N184 Chronic kidney disease, stage 4 (severe): Secondary | ICD-10-CM | POA: Insufficient documentation

## 2013-08-09 DIAGNOSIS — I129 Hypertensive chronic kidney disease with stage 1 through stage 4 chronic kidney disease, or unspecified chronic kidney disease: Secondary | ICD-10-CM | POA: Insufficient documentation

## 2013-08-09 DIAGNOSIS — Z8673 Personal history of transient ischemic attack (TIA), and cerebral infarction without residual deficits: Secondary | ICD-10-CM | POA: Insufficient documentation

## 2013-08-09 DIAGNOSIS — Z4931 Encounter for adequacy testing for hemodialysis: Secondary | ICD-10-CM

## 2013-08-09 DIAGNOSIS — E119 Type 2 diabetes mellitus without complications: Secondary | ICD-10-CM | POA: Insufficient documentation

## 2013-08-09 DIAGNOSIS — Z79899 Other long term (current) drug therapy: Secondary | ICD-10-CM | POA: Insufficient documentation

## 2013-08-09 DIAGNOSIS — N186 End stage renal disease: Secondary | ICD-10-CM

## 2013-08-09 DIAGNOSIS — D649 Anemia, unspecified: Secondary | ICD-10-CM | POA: Insufficient documentation

## 2013-08-09 DIAGNOSIS — I252 Old myocardial infarction: Secondary | ICD-10-CM | POA: Insufficient documentation

## 2013-08-09 HISTORY — PX: BASCILIC VEIN TRANSPOSITION: SHX5742

## 2013-08-09 LAB — GLUCOSE, CAPILLARY
GLUCOSE-CAPILLARY: 67 mg/dL — AB (ref 70–99)
Glucose-Capillary: 86 mg/dL (ref 70–99)
Glucose-Capillary: 102 mg/dL — ABNORMAL HIGH (ref 70–99)

## 2013-08-09 LAB — POCT I-STAT 4, (NA,K, GLUC, HGB,HCT)
Glucose, Bld: 80 mg/dL (ref 70–99)
HCT: 25 % — ABNORMAL LOW (ref 39.0–52.0)
Hemoglobin: 8.5 g/dL — ABNORMAL LOW (ref 13.0–17.0)
POTASSIUM: 4 meq/L (ref 3.7–5.3)
Sodium: 136 mEq/L — ABNORMAL LOW (ref 137–147)

## 2013-08-09 SURGERY — TRANSPOSITION, VEIN, BASILIC
Anesthesia: Monitor Anesthesia Care | Site: Arm Upper | Laterality: Left

## 2013-08-09 MED ORDER — ROCURONIUM BROMIDE 50 MG/5ML IV SOLN
INTRAVENOUS | Status: AC
  Filled 2013-08-09: qty 1

## 2013-08-09 MED ORDER — MIDAZOLAM HCL 5 MG/5ML IJ SOLN
INTRAMUSCULAR | Status: DC | PRN
  Administered 2013-08-09: 2 mg via INTRAVENOUS

## 2013-08-09 MED ORDER — BUPIVACAINE HCL (PF) 0.5 % IJ SOLN
INTRAMUSCULAR | Status: DC | PRN
  Administered 2013-08-09: 30 mL

## 2013-08-09 MED ORDER — 0.9 % SODIUM CHLORIDE (POUR BTL) OPTIME
TOPICAL | Status: DC | PRN
  Administered 2013-08-09: 1000 mL

## 2013-08-09 MED ORDER — FENTANYL CITRATE 0.05 MG/ML IJ SOLN
INTRAMUSCULAR | Status: AC
  Filled 2013-08-09: qty 5

## 2013-08-09 MED ORDER — PROPOFOL INFUSION 10 MG/ML OPTIME
INTRAVENOUS | Status: DC | PRN
  Administered 2013-08-09: 50 ug/kg/min via INTRAVENOUS

## 2013-08-09 MED ORDER — DEXTROSE 50 % IV SOLN
INTRAVENOUS | Status: AC
  Administered 2013-08-09: 12.5 g via INTRAVENOUS
  Filled 2013-08-09: qty 50

## 2013-08-09 MED ORDER — ONDANSETRON HCL 4 MG/2ML IJ SOLN
INTRAMUSCULAR | Status: AC
  Filled 2013-08-09: qty 2

## 2013-08-09 MED ORDER — SODIUM CHLORIDE 0.9 % IV SOLN
INTRAVENOUS | Status: DC
  Administered 2013-08-09: 09:00:00 via INTRAVENOUS

## 2013-08-09 MED ORDER — LIDOCAINE-EPINEPHRINE (PF) 1 %-1:200000 IJ SOLN
INTRAMUSCULAR | Status: DC | PRN
  Administered 2013-08-09: 30 mL

## 2013-08-09 MED ORDER — THROMBIN 20000 UNITS EX SOLR
CUTANEOUS | Status: AC
  Filled 2013-08-09: qty 20000

## 2013-08-09 MED ORDER — SUCCINYLCHOLINE CHLORIDE 20 MG/ML IJ SOLN
INTRAMUSCULAR | Status: AC
  Filled 2013-08-09: qty 1

## 2013-08-09 MED ORDER — ONDANSETRON HCL 4 MG/2ML IJ SOLN
INTRAMUSCULAR | Status: DC | PRN
  Administered 2013-08-09: 4 mg via INTRAVENOUS

## 2013-08-09 MED ORDER — LIDOCAINE HCL (CARDIAC) 20 MG/ML IV SOLN
INTRAVENOUS | Status: AC
  Filled 2013-08-09: qty 5

## 2013-08-09 MED ORDER — EPHEDRINE SULFATE 50 MG/ML IJ SOLN
INTRAMUSCULAR | Status: AC
  Filled 2013-08-09: qty 1

## 2013-08-09 MED ORDER — OXYCODONE-ACETAMINOPHEN 5-325 MG PO TABS
1.0000 | ORAL_TABLET | Freq: Once | ORAL | Status: AC
  Administered 2013-08-09: 1 via ORAL

## 2013-08-09 MED ORDER — OXYCODONE-ACETAMINOPHEN 5-325 MG PO TABS: 1.0000 | ORAL_TABLET | Freq: Four times a day (QID) | ORAL | Status: DC | PRN

## 2013-08-09 MED ORDER — FENTANYL CITRATE 0.05 MG/ML IJ SOLN
INTRAMUSCULAR | Status: DC | PRN
  Administered 2013-08-09 (×3): 25 ug via INTRAVENOUS

## 2013-08-09 MED ORDER — SODIUM CHLORIDE 0.9 % IR SOLN
Status: DC | PRN
  Administered 2013-08-09: 11:00:00

## 2013-08-09 MED ORDER — HEPARIN SODIUM (PORCINE) 1000 UNIT/ML IJ SOLN
INTRAMUSCULAR | Status: AC
  Filled 2013-08-09: qty 1

## 2013-08-09 MED ORDER — MIDAZOLAM HCL 2 MG/2ML IJ SOLN
INTRAMUSCULAR | Status: AC
  Filled 2013-08-09: qty 2

## 2013-08-09 MED ORDER — SODIUM CHLORIDE 0.9 % IV SOLN: INTRAVENOUS | Status: DC

## 2013-08-09 MED ORDER — OXYCODONE-ACETAMINOPHEN 5-325 MG PO TABS
ORAL_TABLET | ORAL | Status: AC
  Administered 2013-08-09: 1 via ORAL
  Filled 2013-08-09: qty 1

## 2013-08-09 MED ORDER — STERILE WATER FOR INJECTION IJ SOLN
INTRAMUSCULAR | Status: AC
  Filled 2013-08-09: qty 10

## 2013-08-09 MED ORDER — LIDOCAINE HCL (CARDIAC) 20 MG/ML IV SOLN
INTRAVENOUS | Status: DC | PRN
  Administered 2013-08-09: 60 mg via INTRAVENOUS

## 2013-08-09 MED ORDER — PROPOFOL 10 MG/ML IV BOLUS
INTRAVENOUS | Status: AC
  Filled 2013-08-09: qty 20

## 2013-08-09 SURGICAL SUPPLY — 44 items
ADH SKN CLS APL DERMABOND .7 (GAUZE/BANDAGES/DRESSINGS) ×1
BLADE 10 SAFETY STRL DISP (BLADE) ×1 IMPLANT
CANISTER SUCTION 2500CC (MISCELLANEOUS) ×3 IMPLANT
CLIP TI MEDIUM 24 (CLIP) ×3 IMPLANT
CLIP TI WIDE RED SMALL 24 (CLIP) ×3 IMPLANT
CORDS BIPOLAR (ELECTRODE) IMPLANT
COVER PROBE W GEL 5X96 (DRAPES) ×2 IMPLANT
COVER SURGICAL LIGHT HANDLE (MISCELLANEOUS) ×3 IMPLANT
DECANTER SPIKE VIAL GLASS SM (MISCELLANEOUS) ×1 IMPLANT
DERMABOND ADVANCED (GAUZE/BANDAGES/DRESSINGS) ×2
DERMABOND ADVANCED .7 DNX12 (GAUZE/BANDAGES/DRESSINGS) ×1 IMPLANT
ELECT REM PT RETURN 9FT ADLT (ELECTROSURGICAL) ×3
ELECTRODE REM PT RTRN 9FT ADLT (ELECTROSURGICAL) ×1 IMPLANT
GLOVE BIO SURGEON STRL SZ7 (GLOVE) ×3 IMPLANT
GLOVE BIOGEL PI IND STRL 6.5 (GLOVE) IMPLANT
GLOVE BIOGEL PI IND STRL 7.0 (GLOVE) IMPLANT
GLOVE BIOGEL PI IND STRL 7.5 (GLOVE) ×1 IMPLANT
GLOVE BIOGEL PI IND STRL 8 (GLOVE) IMPLANT
GLOVE BIOGEL PI INDICATOR 6.5 (GLOVE) ×2
GLOVE BIOGEL PI INDICATOR 7.0 (GLOVE) ×2
GLOVE BIOGEL PI INDICATOR 7.5 (GLOVE) ×4
GLOVE BIOGEL PI INDICATOR 8 (GLOVE) ×2
GLOVE SS BIOGEL STRL SZ 7 (GLOVE) IMPLANT
GLOVE SUPERSENSE BIOGEL SZ 7 (GLOVE) ×2
GOWN STRL REUS W/ TWL LRG LVL3 (GOWN DISPOSABLE) ×3 IMPLANT
GOWN STRL REUS W/ TWL XL LVL3 (GOWN DISPOSABLE) IMPLANT
GOWN STRL REUS W/TWL LRG LVL3 (GOWN DISPOSABLE) ×6
GOWN STRL REUS W/TWL XL LVL3 (GOWN DISPOSABLE) ×3
KIT BASIN OR (CUSTOM PROCEDURE TRAY) ×3 IMPLANT
KIT ROOM TURNOVER OR (KITS) ×3 IMPLANT
NS IRRIG 1000ML POUR BTL (IV SOLUTION) ×3 IMPLANT
PACK CV ACCESS (CUSTOM PROCEDURE TRAY) ×3 IMPLANT
PAD ARMBOARD 7.5X6 YLW CONV (MISCELLANEOUS) ×6 IMPLANT
SPONGE SURGIFOAM ABS GEL 100 (HEMOSTASIS) IMPLANT
SUT MNCRL AB 4-0 PS2 18 (SUTURE) ×3 IMPLANT
SUT PROLENE 6 0 BV (SUTURE) ×3 IMPLANT
SUT PROLENE 7 0 BV 1 (SUTURE) ×4 IMPLANT
SUT SILK 2 0 SH (SUTURE) ×1 IMPLANT
SUT VIC AB 3-0 SH 27 (SUTURE) ×3
SUT VIC AB 3-0 SH 27X BRD (SUTURE) ×1 IMPLANT
TOWEL OR 17X24 6PK STRL BLUE (TOWEL DISPOSABLE) ×3 IMPLANT
TOWEL OR 17X26 10 PK STRL BLUE (TOWEL DISPOSABLE) ×3 IMPLANT
UNDERPAD 30X30 INCONTINENT (UNDERPADS AND DIAPERS) ×3 IMPLANT
WATER STERILE IRR 1000ML POUR (IV SOLUTION) ×3 IMPLANT

## 2013-08-09 NOTE — Transfer of Care (Signed)
Immediate Anesthesia Transfer of Care Note  Patient: Mark Sanford  Procedure(s) Performed: Procedure(s): LEFT 1ST STAGE BRACHIAL VEIN TRANSPOSITION (Left)  Patient Location: PACU  Anesthesia Type:MAC  Level of Consciousness: awake and alert   Airway & Oxygen Therapy: Patient Spontanous Breathing and Patient connected to face mask oxygen  Post-op Assessment: Report given to PACU RN, Post -op Vital signs reviewed and stable and Patient moving all extremities X 4  Post vital signs: Reviewed and stable  Complications: No apparent anesthesia complications

## 2013-08-09 NOTE — Telephone Encounter (Addendum)
Message copied by Doristine Section on Wed Aug 09, 2013  3:50 PM ------      Message from: Ransom, [place] K      Created: Wed Aug 09, 2013  1:13 PM      Regarding: Schedule                   ----- Message -----         From: Ulyses Amor, PA-C         Sent: 08/09/2013  12:19 PM           To: [facility] Charge Pool            F/U with Dr. Bridgett Larsson left AV fistula 1 st stage 6 weeks  notified patient of fu appointment on 09-15-13 8:45am ------

## 2013-08-09 NOTE — Interval H&P Note (Signed)
History and Physical Interval Note:  08/09/2013 8:09 AM  Mark Sanford  has presented today for surgery, with the diagnosis of End Stage Renal Disease  The various methods of treatment have been discussed with the patient and family. After consideration of risks, benefits and other options for treatment, the patient has consented to  Procedure(s): LEFT 1ST STAGE BRACHIAL VEIN TRANSPOSITION (Left) as a surgical intervention .  The patient's history has been reviewed, patient examined, no change in status, stable for surgery.  I have reviewed the patient's chart and labs.  Questions were answered to the patient's satisfaction.     Conrad [place]

## 2013-08-09 NOTE — H&P (View-Only) (Signed)
Referred by:  Dionisio Paschal, NP No address on file  Reason for referral: New access  History of Present Illness  Mark Sanford is a 63 y.o. (05/11/1951) male who presents for evaluation for permanent access.  The patient is right hand dominant.  The patient has not had previous access procedures.  Previous central venous cannulation procedures include: none.  The patient has never had a PPM placed.  From patient's chart, he is chronic kidney disease stage IV-V.  Pt continues to urinate at this point.   Past Medical History  Diagnosis Date  . Diabetes mellitus without complication   . Hypertension   . Renal insufficiency   . Chronic anemia   . Stroke   . Seizures    Past Surgical History: none  History   Social History  . Marital Status: Single    Spouse Name: N/A    Number of Children: N/A  . Years of Education: N/A   Occupational History  . Not on file.   Social History Main Topics  . Smoking status: Former Smoker    Quit date: 02/02/2013  . Smokeless tobacco: Never Used  . Alcohol Use: Yes  . Drug Use: No  . Sexual Activity: Not on file   Other Topics Concern  . Not on file   Social History Narrative  . No narrative on file    Family History  Problem Relation Age of Onset  . Diabetes Mother   . Hypertension Mother   . Diabetes Father   . Hypertension Father     Current Outpatient Prescriptions on File Prior to Visit  Medication Sig Dispense Refill  . amLODipine (NORVASC) 10 MG tablet Take 10 mg by mouth daily.      Marland Kitchen glipiZIDE (GLUCOTROL) 5 MG tablet Take 5 mg by mouth daily.      . metoprolol tartrate (LOPRESSOR) 25 MG tablet Take 25 mg by mouth 2 (two) times daily.      . phenytoin (DILANTIN) 100 MG ER capsule Take 400 mg by mouth at bedtime.       . simvastatin (ZOCOR) 10 MG tablet Take 10 mg by mouth at bedtime.       No current facility-administered medications on file prior to visit.    No Known Allergies  REVIEW OF SYSTEMS:  (Positives  checked otherwise negative)  CARDIOVASCULAR:  []  chest pain, []  chest pressure, []  palpitations, []  shortness of breath when laying flat, []  shortness of breath with exertion,  []  pain in feet when walking, []  pain in feet when laying flat, []  history of blood clot in veins (DVT), []  history of phlebitis, []  swelling in legs, []  varicose veins  PULMONARY:  []  productive cough, []  asthma, []  wheezing  NEUROLOGIC:  []  weakness in arms or legs, []  numbness in arms or legs, []  difficulty speaking or slurred speech, []  temporary loss of vision in one eye, []  dizziness  HEMATOLOGIC:  []  bleeding problems, []  problems with blood clotting too easily  MUSCULOSKEL:  []  joint pain, []  joint swelling  GASTROINTEST:  []  vomiting blood, []  blood in stool     GENITOURINARY:  []  burning with urination, []  blood in urine  PSYCHIATRIC:  []  history of major depression  INTEGUMENTARY:  []  rashes, []  ulcers  CONSTITUTIONAL:  []  fever, []  chills  Physical Examination  Filed Vitals:   08/03/13 1637  BP: 156/87  Pulse: 80  Resp: 14  Height: 5\' 6"  (1.676 m)  Weight: 134 lb (60.782 kg)  Body mass index is 21.64 kg/(m^2).  General: A&O x 3, WD, thin  Head: [place]/AT  Ear/Nose/Throat: Hearing grossly intact, nares w/o erythema or drainage, oropharynx w/o Erythema/Exudate, Mallampati score: 3  Eyes: PERRLA, EOMI  Neck: Supple, no nuchal rigidity, no palpable LAD  Pulmonary: Sym exp, good air movt, CTAB, no rales, rhonchi, & wheezing  Cardiac: RRR, Nl S1, S2, no Murmurs, rubs or gallops  Vascular: Vessel Right Left  Radial Palpable Palpable  Ulnar Faintly Palpable Faintly Palpable  Brachial Palpable Palpable  Carotid Palpable, without bruit Palpable, without bruit  Aorta Not palpable N/A  Femoral Palpable Palpable  Popliteal Not palpable Not palpable  PT Not Palpable Not Palpable  DP Not Palpable Not Palpable   Gastrointestinal: soft, NTND, -G/R, - HSM, - masses, - CVAT  B  Musculoskeletal: M/S 5/5 throughout , Extremities without ischemic changes   Neurologic: CN 2-12 intact , Pain and light touch intact in extremities , Motor exam as listed above  Psychiatric: Judgment intact, Mood & affect appropriate for pt's clinical situation  Dermatologic: See M/S exam for extremity exam, no rashes otherwise noted  Lymph : No Cervical, Axillary, or Inguinal lymphadenopathy   Non-Invasive Vascular Imaging  Vein Mapping  (Date: 08/03/2013):   R arm: acceptable vein conduits include marginal upper arm basilic  L arm: acceptable vein conduits include marginal upper arm basilic  BUE Doppler (Date: 08/03/2013):   R arm:   Brachial: tri  Radial: tri  Ulnar: tri  L arm:   Brachial: tri   Radial: tri  Ulnar: tri  Outside Studies/Documentation 5 pages of outside documents were reviewed including: outside nephrology chart.  Medical Decision Making  [name] is a 63 y.o. male who presents with chronic kidney disease stage IV-V  Given this patient's poor conduit options, I feel B arm and central venography might help determine the best first fistula attempt.  The patient is aware of the risk of anaphylaxis and renal failure and agrees to proceed.  The above procedure which will be scheduled this Monday 27 APR 15.  Adele Barthel, MD [facility] Office: [phone] Pager: [phone]  08/03/2013, 5:07 PM

## 2013-08-09 NOTE — Anesthesia Procedure Notes (Signed)
Procedure Name: MAC Date/Time: 08/09/2013 10:56 AM Performed by: Jacob Moores Pre-anesthesia Checklist: Patient identified, Emergency Drugs available, Suction available and Patient being monitored Patient Re-evaluated:Patient Re-evaluated prior to inductionOxygen Delivery Method: Simple face mask Preoxygenation: Pre-oxygenation with 100% oxygen Intubation Type: IV induction Placement Confirmation: positive ETCO2 and breath sounds checked- equal and bilateral Dental Injury: Teeth and Oropharynx as per pre-operative assessment

## 2013-08-09 NOTE — Anesthesia Postprocedure Evaluation (Signed)
Anesthesia Post Note  Patient: Mark Sanford  Procedure(s) Performed: Procedure(s) (LRB): LEFT 1ST STAGE BRACHIAL VEIN TRANSPOSITION (Left)  Anesthesia type: MAC  Patient location: PACU  Post pain: Pain level controlled  Post assessment: Patient's Cardiovascular Status Stable  Last Vitals:  Filed Vitals:   08/09/13 1254  BP: 160/68  Pulse: 63  Temp: 36.4 C  Resp: 18    Post vital signs: Reviewed and stable  Level of consciousness: sedated  Complications: No apparent anesthesia complications

## 2013-08-09 NOTE — Op Note (Signed)
OPERATIVE NOTE   PROCEDURE: 1. left first stage brachial vein transposition (brachiobrachial arteriovenous fistula) placement  PRE-OPERATIVE DIAGNOSIS: chronic kidney disease stage IV   POST-OPERATIVE DIAGNOSIS: same as above   SURGEON: Adele Barthel, MD  ASSISTANT(S): Gerri Lins, PAC   ANESTHESIA: local and MAC  ESTIMATED BLOOD LOSS: 50 cc  FINDING(S): 1. Somewhat sclerotic brachial vein 2. Strong thrill at end of case with palpable radial pulse  SPECIMEN(S):  none  INDICATIONS:   Mark Sanford is a 63 y.o. male who presents with chronic kidney disease stage IV.  The patient is scheduled for left first stage brachial vein transposition.  The patient is aware the risks include but are not limited to: bleeding, infection, steal syndrome, nerve damage, ischemic monomelic neuropathy, failure to mature, and need for additional procedures.  The patient is aware of the risks of the procedure and elects to proceed forward.  DESCRIPTION: After full informed written consent was obtained from the patient, the patient was brought back to the operating room and placed supine upon the operating table.  Prior to induction, the patient received IV antibiotics.   After obtaining adequate anesthesia, the patient was then prepped and draped in the standard fashion for a left arm access procedure.  I turned my attention first to identifying the patient's brachial vein and brachial artery.  Using SonoSite guidance, the location of these vessels were marked out on the skin.   At this point, I injected local anesthetic to obtain a field block of the antecubitum.  In total, I injected about 10 mL of a 1:1 mixture of 0.5% Marcaine without epinephrine and 1% lidocaine with epinephrine.  I made a longitudinal incision at the level of the antecubitum and dissected through the subcutaneous tissue and fascia to gain exposure of the brachial artery.  This was noted to be 4-5 mm in diameter externally.  This  was dissected out proximally and distally and controlled with vessel loops .  I then dissected out the brachial vein.  This was noted to be 3 mm in diameter externally.  The distal segment of the vein was ligated with a  2-0 silk, and the vein was transected.  The proximal segment was iinterrogated with serial dilators.  The vein accepted up to a 5 mm dilator without any difficulty.  I then instilled the heparinized saline into the vein and clamped it.  At this point, I reset my exposure of the brachial artery and placed the artery under tension proximally and distally.  I made an arteriotomy with a #11 blade, and then I extended the arteriotomy with a Potts scissor.  I injected heparinized saline proximal and distal to this arteriotomy.  The vein was then sewn to the artery in an end-to-side configuration with a running stitch of 7-0 Prolene.  Prior to completing this anastomosis, I allowed the vein and artery to backbleed.  There was no evidence of clot from any vessels.  I completed the anastomosis in the usual fashion and then released all vessel loops and clamps.  There was a palpable thrill in the venous outflow, and there was a palpable radial pulse.  At this point, I irrigated out the surgical wound.  There was no further active bleeding.  The subcutaneous tissue was reapproximated with a running stitch of 3-0 Vicryl.  The skin was then reapproximated with a running subcuticular stitch of 4-0 Vicryl.  The skin was then cleaned, dried, and reinforced with Dermabond.  The patient tolerated this  procedure well.   COMPLICATIONS: none  CONDITION: stable  Adele Barthel, MD [facility] Office: [phone] Pager: [phone]  08/09/2013, 11:57 AM

## 2013-08-09 NOTE — Anesthesia Preprocedure Evaluation (Addendum)
Anesthesia Evaluation  Patient identified by MRN, date of birth, ID band Patient awake    Reviewed: Allergy & Precautions, H&P , NPO status , Patient's Chart, lab work & pertinent test results, reviewed documented beta blocker date and time   History of Anesthesia Complications Negative for: history of anesthetic complications  Airway Mallampati: II TM Distance: >3 FB Neck ROM: Full    Dental  (+) Dental Advisory Given, Edentulous Upper, Edentulous Lower   Pulmonary shortness of breath and with exertion, former smoker,    Pulmonary exam normal       Cardiovascular hypertension, Pt. on medications and Pt. on home beta blockers + Past MI and + Peripheral Vascular Disease     Neuro/Psych Seizures -, Well Controlled,  CVA, Residual Symptoms negative psych ROS   GI/Hepatic (+)     substance abuse  marijuana use,   Endo/Other  diabetes, Type 2, Oral Hypoglycemic Agents  Renal/GU CRFRenal disease     Musculoskeletal   Abdominal   Peds  Hematology   Anesthesia Other Findings   Reproductive/Obstetrics                       Anesthesia Physical Anesthesia Plan  ASA: III  Anesthesia Plan: MAC   Post-op Pain Management:    Induction: Intravenous  Airway Management Planned: Simple Face Mask and Natural Airway  Additional Equipment:   Intra-op Plan:   Post-operative Plan:   Informed Consent: I have reviewed the patients History and Physical, chart, labs and discussed the procedure including the risks, benefits and alternatives for the proposed anesthesia with the patient or authorized representative who has indicated his/her understanding and acceptance.   Dental advisory given  Plan Discussed with: CRNA, Anesthesiologist and Surgeon  Anesthesia Plan Comments:       Anesthesia Quick Evaluation

## 2013-08-10 ENCOUNTER — Encounter (HOSPITAL_COMMUNITY): Payer: Self-pay | Admitting: Vascular Surgery

## 2013-08-14 ENCOUNTER — Ambulatory Visit: Payer: Medicaid Other | Admitting: Surgery

## 2013-08-14 ENCOUNTER — Encounter (HOSPITAL_COMMUNITY): Payer: Medicaid Other

## 2013-08-14 ENCOUNTER — Other Ambulatory Visit (HOSPITAL_COMMUNITY): Payer: Medicaid Other

## 2013-08-16 ENCOUNTER — Encounter (HOSPITAL_COMMUNITY)
Admission: RE | Admit: 2013-08-16 | Discharge: 2013-08-16 | Disposition: A | Discharge status: Home / Self Care | Payer: Medicaid Other | Source: Ambulatory Visit | Attending: Nephrology | Admitting: Nephrology

## 2013-08-16 DIAGNOSIS — D631 Anemia in chronic kidney disease: Secondary | ICD-10-CM | POA: Insufficient documentation

## 2013-08-16 DIAGNOSIS — N183 Chronic kidney disease, stage 3 unspecified: Secondary | ICD-10-CM | POA: Insufficient documentation

## 2013-08-16 DIAGNOSIS — N039 Chronic nephritic syndrome with unspecified morphologic changes: Principal | ICD-10-CM

## 2013-08-16 DIAGNOSIS — Z79899 Other long term (current) drug therapy: Secondary | ICD-10-CM | POA: Insufficient documentation

## 2013-08-16 LAB — HEMOGLOBIN AND HEMATOCRIT, BLOOD
HEMATOCRIT: 24.9 % — AB (ref 39.0–52.0)
Hemoglobin: 8.8 g/dL — ABNORMAL LOW (ref 13.0–17.0)

## 2013-08-16 MED ORDER — EPOETIN ALFA 10000 UNIT/ML IJ SOLN
2000.0000 [IU] | Freq: Once | INTRAMUSCULAR | Status: AC
  Administered 2013-08-16: 2000 [IU] via SUBCUTANEOUS

## 2013-08-16 MED ORDER — EPOETIN ALFA 10000 UNIT/ML IJ SOLN
INTRAMUSCULAR | Status: AC
  Filled 2013-08-16: qty 1

## 2013-08-16 NOTE — Progress Notes (Signed)
Here for hgb/hct and procrit if indicated. Blood drawn and sent to lab for results. Dx CKD. Dx code 585.3, 285.21

## 2013-08-16 NOTE — Progress Notes (Signed)
Results for KAIAN, FA[facility] (MRN [id number]) as of 08/16/2013 10:21  Ref. Range 08/16/2013 10:00  Hemoglobin Latest Range: 13.0-17.0 g/dL 8.8 (L)  HCT Latest Range: 39.0-52.0 % 24.9 (L)  Prior to procrit being given. R Jossalin Chervenak RN

## 2013-09-06 ENCOUNTER — Encounter (HOSPITAL_COMMUNITY): Payer: Medicaid Other

## 2013-09-06 ENCOUNTER — Encounter (HOSPITAL_COMMUNITY)
Admission: RE | Admit: 2013-09-06 | Discharge: 2013-09-06 | Disposition: A | Discharge status: Home / Self Care | Payer: Medicaid Other | Source: Ambulatory Visit | Attending: Nephrology | Admitting: Nephrology

## 2013-09-07 ENCOUNTER — Encounter (HOSPITAL_COMMUNITY): Admission: RE | Admit: 2013-09-07 | Payer: Medicaid Other | Source: Ambulatory Visit

## 2013-09-13 ENCOUNTER — Emergency Department (HOSPITAL_COMMUNITY): Payer: Medicaid Other

## 2013-09-13 ENCOUNTER — Encounter (HOSPITAL_COMMUNITY)
Admission: RE | Admit: 2013-09-13 | Discharge: 2013-09-13 | Disposition: A | Discharge status: Home / Self Care | Payer: Medicaid Other | Source: Ambulatory Visit | Attending: Nephrology | Admitting: Nephrology

## 2013-09-13 ENCOUNTER — Encounter (HOSPITAL_COMMUNITY): Payer: Self-pay | Admitting: Emergency Medicine

## 2013-09-13 ENCOUNTER — Emergency Department (HOSPITAL_COMMUNITY)
Admission: EM | Admit: 2013-09-13 | Discharge: 2013-09-13 | Disposition: A | Discharge status: Home / Self Care | Payer: Medicaid Other | Attending: Emergency Medicine | Admitting: Emergency Medicine

## 2013-09-13 DIAGNOSIS — D631 Anemia in chronic kidney disease: Secondary | ICD-10-CM | POA: Diagnosis present

## 2013-09-13 DIAGNOSIS — N039 Chronic nephritic syndrome with unspecified morphologic changes: Secondary | ICD-10-CM | POA: Diagnosis not present

## 2013-09-13 DIAGNOSIS — E119 Type 2 diabetes mellitus without complications: Secondary | ICD-10-CM | POA: Insufficient documentation

## 2013-09-13 DIAGNOSIS — Z79899 Other long term (current) drug therapy: Secondary | ICD-10-CM | POA: Insufficient documentation

## 2013-09-13 DIAGNOSIS — Z992 Dependence on renal dialysis: Secondary | ICD-10-CM | POA: Insufficient documentation

## 2013-09-13 DIAGNOSIS — N183 Chronic kidney disease, stage 3 unspecified: Secondary | ICD-10-CM | POA: Insufficient documentation

## 2013-09-13 DIAGNOSIS — I10 Essential (primary) hypertension: Secondary | ICD-10-CM | POA: Insufficient documentation

## 2013-09-13 DIAGNOSIS — Z87891 Personal history of nicotine dependence: Secondary | ICD-10-CM | POA: Insufficient documentation

## 2013-09-13 DIAGNOSIS — R599 Enlarged lymph nodes, unspecified: Secondary | ICD-10-CM | POA: Insufficient documentation

## 2013-09-13 DIAGNOSIS — R590 Localized enlarged lymph nodes: Secondary | ICD-10-CM

## 2013-09-13 DIAGNOSIS — Z87448 Personal history of other diseases of urinary system: Secondary | ICD-10-CM | POA: Insufficient documentation

## 2013-09-13 DIAGNOSIS — D649 Anemia, unspecified: Secondary | ICD-10-CM | POA: Insufficient documentation

## 2013-09-13 DIAGNOSIS — Z8673 Personal history of transient ischemic attack (TIA), and cerebral infarction without residual deficits: Secondary | ICD-10-CM | POA: Insufficient documentation

## 2013-09-13 DIAGNOSIS — G40909 Epilepsy, unspecified, not intractable, without status epilepticus: Secondary | ICD-10-CM | POA: Insufficient documentation

## 2013-09-13 LAB — RENAL FUNCTION PANEL
Albumin: 1.8 g/dL — ABNORMAL LOW (ref 3.5–5.2)
BUN: 29 mg/dL — ABNORMAL HIGH (ref 6–23)
CALCIUM: 8 mg/dL — AB (ref 8.4–10.5)
CO2: 22 mEq/L (ref 19–32)
CREATININE: 3.54 mg/dL — AB (ref 0.50–1.35)
Chloride: 98 mEq/L (ref 96–112)
GFR calc Af Amer: 20 mL/min — ABNORMAL LOW (ref >90)
GFR, EST NON AFRICAN AMERICAN: 17 mL/min — AB (ref >90)
Glucose, Bld: 97 mg/dL (ref 70–99)
Phosphorus: 3.4 mg/dL (ref 2.3–4.6)
Potassium: 4 mEq/L (ref 3.7–5.3)
Sodium: 134 mEq/L — ABNORMAL LOW (ref 137–147)

## 2013-09-13 LAB — BASIC METABOLIC PANEL
BUN: 29 mg/dL — ABNORMAL HIGH (ref 6–23)
CHLORIDE: 98 meq/L (ref 96–112)
CO2: 24 meq/L (ref 19–32)
Calcium: 7.7 mg/dL — ABNORMAL LOW (ref 8.4–10.5)
Creatinine, Ser: 3.59 mg/dL — ABNORMAL HIGH (ref 0.50–1.35)
GFR calc non Af Amer: 17 mL/min — ABNORMAL LOW (ref >90)
GFR, EST AFRICAN AMERICAN: 20 mL/min — AB (ref >90)
Glucose, Bld: 127 mg/dL — ABNORMAL HIGH (ref 70–99)
POTASSIUM: 3.8 meq/L (ref 3.7–5.3)
Sodium: 133 mEq/L — ABNORMAL LOW (ref 137–147)

## 2013-09-13 LAB — HEMOGLOBIN AND HEMATOCRIT, BLOOD
HCT: 23.7 % — ABNORMAL LOW (ref 39.0–52.0)
HEMOGLOBIN: 8.3 g/dL — AB (ref 13.0–17.0)

## 2013-09-13 LAB — CBC WITH DIFFERENTIAL/PLATELET
BASOS ABS: 0 10*3/uL (ref 0.0–0.1)
Basophils Relative: 1 % (ref 0–1)
Eosinophils Absolute: 0.1 10*3/uL (ref 0.0–0.7)
Eosinophils Relative: 1 % (ref 0–5)
HCT: 22 % — ABNORMAL LOW (ref 39.0–52.0)
Hemoglobin: 7.6 g/dL — ABNORMAL LOW (ref 13.0–17.0)
LYMPHS ABS: 1.2 10*3/uL (ref 0.7–4.0)
Lymphocytes Relative: 21 % (ref 12–46)
MCH: 34.2 pg — AB (ref 26.0–34.0)
MCHC: 34.5 g/dL (ref 30.0–36.0)
MCV: 99.1 fL (ref 78.0–100.0)
Monocytes Absolute: 0.5 10*3/uL (ref 0.1–1.0)
Monocytes Relative: 9 % (ref 3–12)
Neutro Abs: 3.8 10*3/uL (ref 1.7–7.7)
Neutrophils Relative %: 68 % (ref 43–77)
Platelets: 145 10*3/uL — ABNORMAL LOW (ref 150–400)
RBC: 2.22 MIL/uL — AB (ref 4.22–5.81)
RDW: 13.8 % (ref 11.5–15.5)
WBC: 5.6 10*3/uL (ref 4.0–10.5)

## 2013-09-13 MED ORDER — EPOETIN ALFA 10000 UNIT/ML IJ SOLN
INTRAMUSCULAR | Status: AC
  Filled 2013-09-13: qty 1

## 2013-09-13 MED ORDER — EPOETIN ALFA 10000 UNIT/ML IJ SOLN
2000.0000 [IU] | INTRAMUSCULAR | Status: DC
  Administered 2013-09-13: 2000 [IU] via SUBCUTANEOUS
  Filled 2013-09-13: qty 1

## 2013-09-13 NOTE — ED Notes (Signed)
Pt was at nephrologist this morning and the physician noticed swelling on pt's neck, lt side and swelling to rt jaw, insisted pt come to ER for evaluation. Pt does not co pain at this time.

## 2013-09-13 NOTE — Discharge Instructions (Signed)
Lymphadenopathy °Lymphadenopathy means "disease of the lymph glands." But the term is usually used to describe swollen or enlarged lymph glands, also called lymph nodes. These are the bean-shaped organs found in many locations including the neck, underarm, and groin. Lymph glands are part of the immune system, which fights infections in your body. Lymphadenopathy can occur in just one area of the body, such as the neck, or it can be generalized, with lymph node enlargement in several areas. The nodes found in the neck are the most common sites of lymphadenopathy. °CAUSES  °When your immune system responds to germs (such as viruses or bacteria ), infection-fighting cells and fluid build up. This causes the glands to grow in size. This is usually not something to worry about. Sometimes, the glands themselves can become infected and inflamed. This is called lymphadenitis. °Enlarged lymph nodes can be caused by many diseases: °· Bacterial disease, such as strep throat or a skin infection. °· Viral disease, such as a common cold. °· Other germs, such as lyme disease, tuberculosis, or sexually transmitted diseases. °· Cancers, such as lymphoma (cancer of the lymphatic system) or leukemia (cancer of the white blood cells). °· Inflammatory diseases such as lupus or rheumatoid arthritis. °· Reactions to medications. °Many of the diseases above are rare, but important. This is why you should see your caregiver if you have lymphadenopathy. °SYMPTOMS  °· Swollen, enlarged lumps in the neck, back of the head or other locations. °· Tenderness. °· Warmth or redness of the skin over the lymph nodes. °· Fever. °DIAGNOSIS  °Enlarged lymph nodes are often near the source of infection. They can help healthcare providers diagnose your illness. For instance:  °· Swollen lymph nodes around the jaw might be caused by an infection in the mouth. °· Enlarged glands in the neck often signal a throat infection. °· Lymph nodes that are swollen  in more than one area often indicate an illness caused by a virus. °Your caregiver most likely will know what is causing your lymphadenopathy after listening to your history and examining you. Blood tests, x-rays or other tests may be needed. If the cause of the enlarged lymph node cannot be found, and it does not go away by itself, then a biopsy may be needed. Your caregiver will discuss this with you. °TREATMENT  °Treatment for your enlarged lymph nodes will depend on the cause. Many times the nodes will shrink to normal size by themselves, with no treatment. Antibiotics or other medicines may be needed for infection. Only take over-the-counter or prescription medicines for pain, discomfort or fever as directed by your caregiver. °HOME CARE INSTRUCTIONS  °Swollen lymph glands usually return to normal when the underlying medical condition goes away. If they persist, contact your health-care provider. He/she might prescribe antibiotics or other treatments, depending on the diagnosis. Take any medications exactly as prescribed. Keep any follow-up appointments made to check on the condition of your enlarged nodes.  °SEEK MEDICAL CARE IF:  °· Swelling lasts for more than two weeks. °· You have symptoms such as weight loss, night sweats, fatigue or fever that does not go away. °· The lymph nodes are hard, seem fixed to the skin or are growing rapidly. °· Skin over the lymph nodes is red and inflamed. This could mean there is an infection. °SEEK IMMEDIATE MEDICAL CARE IF:  °· Fluid starts leaking from the area of the enlarged lymph node. °· You develop a fever of 102° F (38.9° C) or greater. °· Severe   pain develops (not necessarily at the site of a large lymph node).  You develop chest pain or shortness of breath.  You develop worsening abdominal pain. MAKE SURE YOU:   Understand these instructions.  Will watch your condition.  Will get help right away if you are not doing well or get worse. Document  Released: 01/07/2008 Document Revised: 06/22/2011 Document Reviewed: 01/07/2008 [facility] Patient Information 2014 [place].  Cervical Adenitis You have a swollen lymph gland in your neck. This commonly happens with Strep and virus infections, dental problems, insect bites, and injuries about the face, scalp, or neck. The lymph glands swell as the body fights the infection or heals the injury. Swelling and firmness typically lasts for several weeks after the infection or injury is healed. Rarely lymph glands can become swollen because of cancer or TB. Antibiotics are prescribed if there is evidence of an infection. Sometimes an infected lymph gland becomes filled with pus. This condition may require opening up the abscessed gland by draining it surgically. Most of the time infected glands return to normal within two weeks. Do not poke or squeeze the swollen lymph nodes. That may keep them from shrinking back to their normal size. If the lymph gland is still swollen after 2 weeks, further medical evaluation is needed.  SEEK IMMEDIATE MEDICAL CARE IF:  You have difficulty swallowing or breathing, increased swelling, severe pain, or a high fever.  Document Released: 03/30/2005 Document Revised: 06/22/2011 Document Reviewed: 09/19/2006 [facility] Patient Information 2014 [place].

## 2013-09-13 NOTE — ED Provider Notes (Signed)
CSN: [id number]     Arrival date & time 09/13/13  1305 History   First MD Initiated Contact with Patient 09/13/13 1340     Chief Complaint  Patient presents with  . Mass    neck, both sides     (Consider location/radiation/quality/duration/timing/severity/associated sxs/prior Treatment) The history is provided by the patient and a relative.   MARKEESE BOYAJIAN is a 63 y.o. male presents here for evaluation of left and right neck swelling. He had not been bothered by it, but today, his nephrologist noticed it. His nephrologist advised him to start taking his injection every 2 weeks, and followup with his primary care doctor. Family could not get him in to the primary care Dr., into August, so brought him here. He denies recent fever, chills, nausea, vomiting, weakness, dizziness, anorexia, dysphasia, shortness of breath, or chest pain. He is being treated with Epogen, for 2 months, last dose, this morning. He's not on any other new medications. There are no other known modifying factors.     Past Medical History  Diagnosis Date  . Diabetes mellitus without complication   . Hypertension   . Renal insufficiency   . Chronic anemia   . Stroke   . Seizures    Past Surgical History  Procedure Laterality Date  . Bascilic vein transposition Left 08/09/2013    Procedure: LEFT 1ST STAGE BRACHIAL VEIN TRANSPOSITION;  Surgeon: Conrad [place], MD;  Location: [facility] OR;  Service: Vascular;  Laterality: Left;   Family History  Problem Relation Age of Onset  . Diabetes Mother   . Hypertension Mother   . Diabetes Father   . Hypertension Father    History  Substance Use Topics  . Smoking status: Former Smoker    Quit date: 02/02/2013  . Smokeless tobacco: Never Used  . Alcohol Use: Yes    Review of Systems  All other systems reviewed and are negative.     Allergies  Review of patient's allergies indicates no known allergies.  Home Medications   Prior to Admission medications   Medication  Sig Start Date End Date Taking? Authorizing Provider  amLODipine (NORVASC) 10 MG tablet Take 10 mg by mouth daily.   Yes Historical Provider, MD  glipiZIDE (GLUCOTROL) 5 MG tablet Take 5 mg by mouth daily.   Yes Historical Provider, MD  metoprolol tartrate (LOPRESSOR) 25 MG tablet Take 25 mg by mouth 2 (two) times daily.   Yes Historical Provider, MD  phenytoin (DILANTIN) 100 MG ER capsule Take 300 mg by mouth at bedtime.    Yes Historical Provider, MD  simvastatin (ZOCOR) 10 MG tablet Take 10 mg by mouth at bedtime.   Yes Historical Provider, MD  losartan (COZAAR) 100 MG tablet Take 100 mg by mouth daily.    Historical Provider, MD   BP 160/60  Pulse 60  Temp(Src) 97.7 F (36.5 C) (Oral)  Resp 18  Ht 5\' 6"  (1.676 m)  Wt 130 lb (58.968 kg)  BMI 20.99 kg/m2  SpO2 100% Physical Exam  Nursing note and vitals reviewed. Constitutional: He is oriented to person, place, and time. He appears well-developed and well-nourished.  HENT:  Head: Normocephalic and atraumatic.  Right Ear: External ear normal.  Left Ear: External ear normal.  Eyes: Conjunctivae and EOM are normal. Pupils are equal, round, and reactive to light.  Neck: Normal range of motion and phonation normal. Neck supple.  Cardiovascular: Normal rate, regular rhythm, normal heart sounds and intact distal pulses.   Vascular graft,  left arm, with recent surgical site, appears normal  Pulmonary/Chest: Effort normal and breath sounds normal. He exhibits no bony tenderness.  Abdominal: Soft. There is no tenderness.  Musculoskeletal: Normal range of motion.  Lymphadenopathy:       Head (right side): No submental, no submandibular, no tonsillar, no preauricular, no posterior auricular and no occipital adenopathy present.       Head (left side): No submental, no submandibular, no tonsillar, no preauricular, no posterior auricular and no occipital adenopathy present.    He has cervical adenopathy.       Right cervical: Superficial  cervical (right upper, 2 x 2 centimeters) adenopathy present.       Left cervical: Superficial cervical (Two, upper, closely adherent, together they form a mass about 3 x 4 cm) adenopathy present.       Right axillary: No pectoral and no lateral adenopathy present.       Left axillary: No pectoral and no lateral adenopathy present.      Right: No inguinal adenopathy present.       Left: No inguinal adenopathy present.  No popliteal nodes, bilateral. All the lymph nodes are nontender to palpation.  Neurological: He is alert and oriented to person, place, and time. No cranial nerve deficit or sensory deficit. He exhibits normal muscle tone. Coordination normal.  Skin: Skin is warm, dry and intact.  Psychiatric: He has a normal mood and affect. His behavior is normal. Judgment and thought content normal.    ED Course  Procedures (including critical care time)  Medications - No data to display  Patient Vitals for the past 24 hrs:  BP Temp Temp src Pulse Resp SpO2 Height Weight  09/13/13 1501 160/60 mmHg - - 60 18 100 % - -  09/13/13 1319 153/79 mmHg 97.7 F (36.5 C) Oral 69 18 100 % 5\' 6"  (1.676 m) 130 lb (58.968 kg)    2:21 PM Reevaluation with update and discussion. After initial assessment and treatment, an updated evaluation reveals no additional c/o. Findings discussed with patient and family, all questions answered. [place] Review Labs Reviewed  CBC WITH DIFFERENTIAL - Abnormal; Notable for the following:    RBC 2.22 (*)    Hemoglobin 7.6 (*)    HCT 22.0 (*)    MCH 34.2 (*)    Platelets 145 (*)    All other components within normal limits  BASIC METABOLIC PANEL - Abnormal; Notable for the following:    Sodium 133 (*)    Glucose, Bld 127 (*)    BUN 29 (*)    Creatinine, Ser 3.59 (*)    Calcium 7.7 (*)    GFR calc non Af Amer 17 (*)    GFR calc Af Amer 20 (*)    All other components within normal limits    Imaging Review Dg Chest 2 View  09/13/2013    CLINICAL DATA:  Chest mass  EXAM: CHEST  2 VIEW  COMPARISON:  August 09, 2013  FINDINGS: There is no edema or consolidation. Heart size and pulmonary vascularity are normal. There is no appreciable adenopathy.  There is a bony exostosis arising anterior to the upper sternum. There are no blastic or lytic bone lesions. There is degenerative change in the thoracic spine.  IMPRESSION: Bony exostosis arising from the anterior aspect of the upper sternum near the sternomanubrial joint. This exostosis measures 2.5 x 2.1 cm in size. Question whether this exostosis corresponds to the area of concern  on physical examination. The lungs are clear. No adenopathy. If further imaging assessment is felt to be advised based on clinical findings, chest CT would be the imaging study of choice for further assessment.   Electronically Signed   By: Lowella Grip M.D.   On: 09/13/2013 14:48     EKG Interpretation None      MDM   Final diagnoses:  Cervical adenopathy   Cervical adenopathy, etiology is not clear. There is no apparent acute bacterial infection causing the adenopathy, no apparent head and neck cancer, no apparent skin infection, and no apparent systemic allergic reaction.  The lymph nodes are readily accessible for biopsy, this procedure is within purview of the ENT.  Nursing Notes Reviewed/ Care Coordinated Applicable Imaging Reviewed Interpretation of Laboratory Data incorporated into ED treatment  The patient appears reasonably screened and/or stabilized for discharge and I doubt any other medical condition or other [facility] requiring further screening, evaluation, or treatment in the ED at this time prior to discharge.  Plan: Home Medications- usual; Home Treatments- rest; return here if the recommended treatment, does not improve the symptoms; Recommended follow up- ENT 1 week    Richarda Blade, MD 09/13/13 [phone]

## 2013-09-14 ENCOUNTER — Encounter: Payer: Self-pay | Admitting: Vascular Surgery

## 2013-09-14 LAB — PTH, INTACT AND CALCIUM
CALCIUM TOTAL (PTH): 7.8 mg/dL — AB (ref 8.4–10.5)
PTH: 140 pg/mL — ABNORMAL HIGH (ref 14.0–72.0)

## 2013-09-15 ENCOUNTER — Ambulatory Visit (INDEPENDENT_AMBULATORY_CARE_PROVIDER_SITE_OTHER): Payer: Self-pay | Admitting: Vascular Surgery

## 2013-09-15 ENCOUNTER — Encounter: Payer: Self-pay | Admitting: Vascular Surgery

## 2013-09-15 VITALS — BP 161/68 | HR 87 | Ht 66.0 in | Wt 132.0 lb

## 2013-09-15 DIAGNOSIS — N184 Chronic kidney disease, stage 4 (severe): Secondary | ICD-10-CM

## 2013-09-15 DIAGNOSIS — N186 End stage renal disease: Secondary | ICD-10-CM | POA: Insufficient documentation

## 2013-09-15 NOTE — Progress Notes (Addendum)
Postoperative Access Visit   History of Present Illness  Mark Sanford is a 63 y.o. 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  The patient's wounds are  healed.  The patient notes no steal symptoms.  The patient is able to complete their activities of daily living.  The patient's current symptoms are: left arm numbness (present since his prior stroke).  Pt was found to have neck masses recently.  He has not seen ENT yet.  Past Medical History  Diagnosis Date  . Diabetes mellitus without complication   . Hypertension   . Renal insufficiency   . Chronic anemia   . Stroke   . Seizures     Past Surgical History  Procedure Laterality Date  . Bascilic vein transposition Left 08/09/2013    Procedure: LEFT 1ST STAGE BRACHIAL VEIN TRANSPOSITION;  Surgeon: Conrad Renningers, MD;  Location: [place];  Service: Vascular;  Laterality: Left;    History   Social History  . Marital Status: Single    Spouse Name: N/A    Number of Children: N/A  . Years of Education: N/A   Occupational History  . Not on file.   Social History Main Topics  . Smoking status: Former Smoker    Quit date: 02/02/2013  . Smokeless tobacco: Never Used  . Alcohol Use: Yes  . Drug Use: No  . Sexual Activity: Not on file   Other Topics Concern  . Not on file   Social History Narrative  . No narrative on file    Family History  Problem Relation Age of Onset  . Diabetes Mother   . Hypertension Mother   . Diabetes Father   . Hypertension Father      Current Outpatient Prescriptions on File Prior to Visit  Medication Sig Dispense Refill  . amLODipine (NORVASC) 10 MG tablet Take 10 mg by mouth daily.      . metoprolol tartrate (LOPRESSOR) 25 MG tablet Take 25 mg by mouth 2 (two) times daily.      . phenytoin (DILANTIN) 100 MG ER capsule Take 300 mg by mouth at bedtime.       Marland Kitchen glipiZIDE (GLUCOTROL) 5 MG tablet Take 5 mg by mouth daily.      Marland Kitchen losartan  (COZAAR) 100 MG tablet Take 100 mg by mouth daily.      . simvastatin (ZOCOR) 10 MG tablet Take 10 mg by mouth at bedtime.       No current facility-administered medications on file prior to visit.    No Known Allergies  REVIEW OF SYSTEMS:  (Positives checked otherwise negative)  CARDIOVASCULAR:  []  chest pain, []  chest pressure, []  palpitations, []  shortness of breath when laying flat, []  shortness of breath with exertion,  []  pain in feet when walking, []  pain in feet when laying flat, []  history of blood clot in veins (DVT), []  history of phlebitis, []  swelling in legs, []  varicose veins  PULMONARY:  []  productive cough, []  asthma, []  wheezing  NEUROLOGIC:  []  weakness in arms or legs, []  numbness in arms or legs, []  difficulty speaking or slurred speech, []  temporary loss of vision in one eye, []  dizziness  HEMATOLOGIC:  []  bleeding problems, []  problems with blood clotting too easily  MUSCULOSKEL:  []  joint pain, []  joint swelling  GASTROINTEST:  []  vomiting blood, []  blood in stool     GENITOURINARY:  []  burning with urination, []  blood in  urine  PSYCHIATRIC:  []  history of major depression  INTEGUMENTARY:  []  rashes, []  ulcers     For VQI Use Only  PRE-ADM LIVING: Home  AMB STATUS: Ambulatory  Physical Examination Filed Vitals:   09/15/13 0850  BP: 161/68  Pulse: 87   NECK: bil posterior cervical masses which are firm (L>>R)  Pulmonary: Sym exp, good air movt, CTAB, no rales, rhonchi, & wheezing  Cardiac: RRR, Nl S1, S2, no Murmurs, rubs or gallops  LUE: Incision is healed, skin feels warm, hand grip is 5/5, sensation in digits is intact, palpable thrill, bruit can be auscultated , 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 year old male who presents s/p L 1st BRVT.  The patient's L 2nd stage BRVT is scheduled for 6 JUL 14.  If he needs any further ENT work-up, that should take priority as there is a  potential airway issue in this patient.  Thank you for allowing [place] to participate in this patient's care.  Adele Barthel, MD [facility] Office: [phone] Pager: [phone]  09/15/2013, 9:16 AM

## 2013-09-21 ENCOUNTER — Other Ambulatory Visit (HOSPITAL_COMMUNITY): Payer: Medicaid Other

## 2013-09-21 ENCOUNTER — Other Ambulatory Visit: Payer: Self-pay | Admitting: Otolaryngology

## 2013-09-21 ENCOUNTER — Inpatient Hospital Stay (HOSPITAL_COMMUNITY): Admission: RE | Admit: 2013-09-21 | Payer: Medicaid Other | Source: Ambulatory Visit

## 2013-09-21 ENCOUNTER — Other Ambulatory Visit (HOSPITAL_COMMUNITY)
Admission: RE | Admit: 2013-09-21 | Discharge: 2013-09-21 | Disposition: A | Discharge status: Home / Self Care | Payer: Medicaid Other | Source: Ambulatory Visit | Attending: Otolaryngology | Admitting: Otolaryngology

## 2013-09-21 DIAGNOSIS — C8581 Other specified types of non-Hodgkin lymphoma, lymph nodes of head, face, and neck: Secondary | ICD-10-CM | POA: Insufficient documentation

## 2013-09-22 ENCOUNTER — Other Ambulatory Visit: Payer: Self-pay

## 2013-09-27 ENCOUNTER — Encounter (HOSPITAL_COMMUNITY)
Admission: RE | Admit: 2013-09-27 | Discharge: 2013-09-27 | Disposition: A | Discharge status: Home / Self Care | Payer: Medicaid Other | Source: Ambulatory Visit | Attending: Nephrology | Admitting: Nephrology

## 2013-09-27 NOTE — Progress Notes (Addendum)
Pt called because of no show for Procrit injection. States he will not return until... " they find out if I have cancer in my throat". He will follow up with me later in the month.

## 2013-10-04 LAB — IRON AND TIBC
IRON: 53 ug/dL (ref 42–135)
SATURATION RATIOS: 40 % (ref 20–55)
TIBC: 134 ug/dL — ABNORMAL LOW (ref 215–435)
UIBC: 81 ug/dL — AB (ref 125–400)

## 2013-10-04 LAB — FERRITIN: FERRITIN: 298 ng/mL (ref 22–322)

## 2013-10-04 LAB — VITAMIN D 25 HYDROXY (VIT D DEFICIENCY, FRACTURES): Vit D, 25-Hydroxy: 16 ng/mL — ABNORMAL LOW (ref 30–89)

## 2013-10-12 NOTE — Progress Notes (Signed)
Spoke with pt niece as requested by pt and according to niece Darlys Gales, pt procedure will have to be cancelled because pt has another procedure scheduled on the same day at [facility]. Pt niece advised to call Dr. Bridgett Larsson and make aware.

## 2013-10-17 ENCOUNTER — Encounter (HOSPITAL_COMMUNITY): Payer: Self-pay | Admitting: Emergency Medicine

## 2013-10-17 ENCOUNTER — Observation Stay (HOSPITAL_COMMUNITY)
Admission: EM | Admit: 2013-10-17 | Discharge: 2013-10-18 | DRG: 811 | Disposition: A | Discharge status: Home / Self Care | Payer: Medicaid Other | Attending: Family Medicine | Admitting: Family Medicine

## 2013-10-17 DIAGNOSIS — I951 Orthostatic hypotension: Secondary | ICD-10-CM | POA: Diagnosis present

## 2013-10-17 DIAGNOSIS — D631 Anemia in chronic kidney disease: Principal | ICD-10-CM

## 2013-10-17 DIAGNOSIS — F17200 Nicotine dependence, unspecified, uncomplicated: Secondary | ICD-10-CM

## 2013-10-17 DIAGNOSIS — Z833 Family history of diabetes mellitus: Secondary | ICD-10-CM

## 2013-10-17 DIAGNOSIS — Z87891 Personal history of nicotine dependence: Secondary | ICD-10-CM

## 2013-10-17 DIAGNOSIS — E119 Type 2 diabetes mellitus without complications: Secondary | ICD-10-CM

## 2013-10-17 DIAGNOSIS — Z8249 Family history of ischemic heart disease and other diseases of the circulatory system: Secondary | ICD-10-CM | POA: Diagnosis not present

## 2013-10-17 DIAGNOSIS — Z8673 Personal history of transient ischemic attack (TIA), and cerebral infarction without residual deficits: Secondary | ICD-10-CM

## 2013-10-17 DIAGNOSIS — C50919 Malignant neoplasm of unspecified site of unspecified female breast: Secondary | ICD-10-CM | POA: Diagnosis present

## 2013-10-17 DIAGNOSIS — N186 End stage renal disease: Secondary | ICD-10-CM

## 2013-10-17 DIAGNOSIS — D509 Iron deficiency anemia, unspecified: Secondary | ICD-10-CM

## 2013-10-17 DIAGNOSIS — I252 Old myocardial infarction: Secondary | ICD-10-CM

## 2013-10-17 DIAGNOSIS — R569 Unspecified convulsions: Secondary | ICD-10-CM

## 2013-10-17 DIAGNOSIS — R5383 Other fatigue: Secondary | ICD-10-CM | POA: Diagnosis present

## 2013-10-17 DIAGNOSIS — N039 Chronic nephritic syndrome with unspecified morphologic changes: Secondary | ICD-10-CM | POA: Diagnosis not present

## 2013-10-17 DIAGNOSIS — I120 Hypertensive chronic kidney disease with stage 5 chronic kidney disease or end stage renal disease: Secondary | ICD-10-CM | POA: Diagnosis present

## 2013-10-17 DIAGNOSIS — N189 Chronic kidney disease, unspecified: Secondary | ICD-10-CM

## 2013-10-17 DIAGNOSIS — R5381 Other malaise: Secondary | ICD-10-CM | POA: Diagnosis present

## 2013-10-17 DIAGNOSIS — D696 Thrombocytopenia, unspecified: Secondary | ICD-10-CM | POA: Diagnosis present

## 2013-10-17 DIAGNOSIS — C059 Malignant neoplasm of palate, unspecified: Secondary | ICD-10-CM | POA: Diagnosis present

## 2013-10-17 DIAGNOSIS — N179 Acute kidney failure, unspecified: Secondary | ICD-10-CM

## 2013-10-17 DIAGNOSIS — Z720 Tobacco use: Secondary | ICD-10-CM

## 2013-10-17 DIAGNOSIS — E162 Hypoglycemia, unspecified: Secondary | ICD-10-CM

## 2013-10-17 DIAGNOSIS — E860 Dehydration: Secondary | ICD-10-CM | POA: Diagnosis present

## 2013-10-17 DIAGNOSIS — W19XXXA Unspecified fall, initial encounter: Secondary | ICD-10-CM

## 2013-10-17 DIAGNOSIS — I10 Essential (primary) hypertension: Secondary | ICD-10-CM

## 2013-10-17 DIAGNOSIS — I6350 Cerebral infarction due to unspecified occlusion or stenosis of unspecified cerebral artery: Secondary | ICD-10-CM

## 2013-10-17 DIAGNOSIS — F1210 Cannabis abuse, uncomplicated: Secondary | ICD-10-CM

## 2013-10-17 DIAGNOSIS — M7580 Other shoulder lesions, unspecified shoulder: Secondary | ICD-10-CM

## 2013-10-17 DIAGNOSIS — M25819 Other specified joint disorders, unspecified shoulder: Secondary | ICD-10-CM

## 2013-10-17 DIAGNOSIS — R198 Other specified symptoms and signs involving the digestive system and abdomen: Secondary | ICD-10-CM

## 2013-10-17 DIAGNOSIS — N390 Urinary tract infection, site not specified: Secondary | ICD-10-CM | POA: Diagnosis present

## 2013-10-17 DIAGNOSIS — Z8679 Personal history of other diseases of the circulatory system: Secondary | ICD-10-CM

## 2013-10-17 DIAGNOSIS — Q279 Congenital malformation of peripheral vascular system, unspecified: Secondary | ICD-10-CM

## 2013-10-17 DIAGNOSIS — C779 Secondary and unspecified malignant neoplasm of lymph node, unspecified: Secondary | ICD-10-CM

## 2013-10-17 DIAGNOSIS — N184 Chronic kidney disease, stage 4 (severe): Secondary | ICD-10-CM

## 2013-10-17 DIAGNOSIS — D649 Anemia, unspecified: Secondary | ICD-10-CM | POA: Diagnosis present

## 2013-10-17 DIAGNOSIS — K922 Gastrointestinal hemorrhage, unspecified: Secondary | ICD-10-CM

## 2013-10-17 DIAGNOSIS — E785 Hyperlipidemia, unspecified: Secondary | ICD-10-CM

## 2013-10-17 HISTORY — DX: Malignant (primary) neoplasm, unspecified: C80.1

## 2013-10-17 LAB — CBC WITH DIFFERENTIAL/PLATELET
BASOS PCT: 0 % (ref 0–1)
Basophils Absolute: 0 10*3/uL (ref 0.0–0.1)
Eosinophils Absolute: 0.1 10*3/uL (ref 0.0–0.7)
Eosinophils Relative: 1 % (ref 0–5)
HCT: 19 % — ABNORMAL LOW (ref 39.0–52.0)
HEMOGLOBIN: 6.7 g/dL — AB (ref 13.0–17.0)
LYMPHS ABS: 0.8 10*3/uL (ref 0.7–4.0)
LYMPHS PCT: 13 % (ref 12–46)
MCH: 34.4 pg — ABNORMAL HIGH (ref 26.0–34.0)
MCHC: 34.7 g/dL (ref 30.0–36.0)
MCV: 99 fL (ref 78.0–100.0)
Monocytes Absolute: 0.6 10*3/uL (ref 0.1–1.0)
Monocytes Relative: 9 % (ref 3–12)
NEUTROS PCT: 76 % (ref 43–77)
Neutro Abs: 4.7 10*3/uL (ref 1.7–7.7)
Platelets: 142 10*3/uL — ABNORMAL LOW (ref 150–400)
RBC: 1.92 MIL/uL — AB (ref 4.22–5.81)
RDW: 13.4 % (ref 11.5–15.5)
WBC: 6.1 10*3/uL (ref 4.0–10.5)

## 2013-10-17 LAB — URINE MICROSCOPIC-ADD ON

## 2013-10-17 LAB — PREPARE RBC (CROSSMATCH)

## 2013-10-17 LAB — COMPREHENSIVE METABOLIC PANEL
ALBUMIN: 1.8 g/dL — AB (ref 3.5–5.2)
ALT: 8 U/L (ref 0–53)
ANION GAP: 11 (ref 5–15)
AST: 18 U/L (ref 0–37)
Alkaline Phosphatase: 121 U/L — ABNORMAL HIGH (ref 39–117)
BILIRUBIN TOTAL: 0.2 mg/dL — AB (ref 0.3–1.2)
BUN: 28 mg/dL — ABNORMAL HIGH (ref 6–23)
CO2: 24 mEq/L (ref 19–32)
Calcium: 7.7 mg/dL — ABNORMAL LOW (ref 8.4–10.5)
Chloride: 100 mEq/L (ref 96–112)
Creatinine, Ser: 4.38 mg/dL — ABNORMAL HIGH (ref 0.50–1.35)
GFR calc Af Amer: 15 mL/min — ABNORMAL LOW (ref >90)
GFR, EST NON AFRICAN AMERICAN: 13 mL/min — AB (ref >90)
Glucose, Bld: 147 mg/dL — ABNORMAL HIGH (ref 70–99)
POTASSIUM: 4 meq/L (ref 3.7–5.3)
Sodium: 135 mEq/L — ABNORMAL LOW (ref 137–147)
Total Protein: 6 g/dL (ref 6.0–8.3)

## 2013-10-17 LAB — URINALYSIS, ROUTINE W REFLEX MICROSCOPIC
Glucose, UA: 100 mg/dL — AB
LEUKOCYTES UA: NEGATIVE
NITRITE: NEGATIVE
PH: 5.5 (ref 5.0–8.0)
Specific Gravity, Urine: >1.03 — ABNORMAL HIGH (ref 1.005–1.030)
Urobilinogen, UA: 0.2 mg/dL (ref 0.0–1.0)

## 2013-10-17 LAB — GLUCOSE, CAPILLARY: GLUCOSE-CAPILLARY: 166 mg/dL — AB (ref 70–99)

## 2013-10-17 LAB — CBG MONITORING, ED: GLUCOSE-CAPILLARY: 150 mg/dL — AB (ref 70–99)

## 2013-10-17 MED ORDER — INSULIN ASPART 100 UNIT/ML ~~LOC~~ SOLN: 0.0000 [IU] | Freq: Every day | SUBCUTANEOUS | Status: DC

## 2013-10-17 MED ORDER — ONDANSETRON HCL 4 MG/2ML IJ SOLN: 4.0000 mg | Freq: Four times a day (QID) | INTRAMUSCULAR | Status: DC | PRN

## 2013-10-17 MED ORDER — METOPROLOL TARTRATE 25 MG PO TABS
25.0000 mg | ORAL_TABLET | Freq: Two times a day (BID) | ORAL | Status: DC
  Administered 2013-10-17 – 2013-10-18 (×2): 25 mg via ORAL
  Filled 2013-10-17 (×2): qty 1

## 2013-10-17 MED ORDER — CEFTRIAXONE SODIUM 1 G IJ SOLR
1.0000 g | Freq: Once | INTRAMUSCULAR | Status: AC
  Administered 2013-10-17: 1 g via INTRAVENOUS
  Filled 2013-10-17: qty 10

## 2013-10-17 MED ORDER — PHENYTOIN SODIUM EXTENDED 100 MG PO CAPS
300.0000 mg | ORAL_CAPSULE | Freq: Every day | ORAL | Status: DC
  Administered 2013-10-17: 300 mg via ORAL
  Filled 2013-10-17: qty 3

## 2013-10-17 MED ORDER — SODIUM CHLORIDE 0.9 % IV SOLN
1000.0000 mL | Freq: Once | INTRAVENOUS | Status: AC
  Administered 2013-10-17: 1000 mL via INTRAVENOUS

## 2013-10-17 MED ORDER — SIMVASTATIN 10 MG PO TABS
10.0000 mg | ORAL_TABLET | Freq: Every day | ORAL | Status: DC
  Administered 2013-10-17: 10 mg via ORAL
  Filled 2013-10-17 (×3): qty 1

## 2013-10-17 MED ORDER — SODIUM CHLORIDE 0.9 % IV SOLN
1000.0000 mL | INTRAVENOUS | Status: DC
  Administered 2013-10-18: 1000 mL via INTRAVENOUS

## 2013-10-17 MED ORDER — INSULIN ASPART 100 UNIT/ML ~~LOC~~ SOLN
0.0000 [IU] | Freq: Three times a day (TID) | SUBCUTANEOUS | Status: DC
  Administered 2013-10-18: 2 [IU] via SUBCUTANEOUS

## 2013-10-17 MED ORDER — AMLODIPINE BESYLATE 5 MG PO TABS
10.0000 mg | ORAL_TABLET | Freq: Every day | ORAL | Status: DC
  Administered 2013-10-17 – 2013-10-18 (×2): 10 mg via ORAL
  Filled 2013-10-17 (×2): qty 2

## 2013-10-17 MED ORDER — ONDANSETRON HCL 4 MG PO TABS: 4.0000 mg | ORAL_TABLET | Freq: Four times a day (QID) | ORAL | Status: DC | PRN

## 2013-10-17 NOTE — ED Provider Notes (Signed)
CSN: [id number]     Arrival date & time 10/17/13  1605 History   This chart was scribed for Janice Norrie, MD by Lowella Petties, ED Scribe. The patient was seen in room APA08/APA08. Patient's care was started at 5:36 PM.    Chief Complaint  Patient presents with  . Fall   The history is provided by the patient. No language interpreter was used.   HPI Comments: Mark Sanford is a 63 y.o. 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 He came to the hospital to visit a relative. He states he had been feeling weak all day. He states when he got out of the car his legs gave out. He reports that sometimes his legs give out from under him. He reports that he fell in a sitting motion. He denies head trauma. He reports mild pain in his buttocks and generalized weakness. He reports 1 episode of emesis after his fall. He reports an intermittent cough for the past month or 2. He states that had an appointment with the cancer center yesterday and that he will begin his initial treatment in 3 days. He reports that he has not been eating as much as he used to. He reports losing 65 lbs over the past 5 years. He reports a history of blood transfusion 3 or 4 years ago. He denies melena and hematochezia. He denies fever or chills. He denies any injury from his fall.   He reports seeing a PCP at the [place]. He reports that he is able to ambulate normally after having a stroke and is able to mow grass.   PCP [facility] Department   Past Medical History  Diagnosis Date  . Diabetes mellitus without complication   . Hypertension   . Chronic anemia   . Stroke   . Seizures   . Cancer   . Renal insufficiency    Past Surgical History  Procedure Laterality Date  . Bascilic vein transposition Left 08/09/2013     Procedure: LEFT 1ST STAGE BRACHIAL VEIN TRANSPOSITION;  Surgeon: Conrad Baring, MD;  Location: [facility] OR;  Service: Vascular;  Laterality: Left;   Family History  Problem Relation Age of Onset  . Diabetes Mother   . Hypertension Mother   . Diabetes Father   . Hypertension Father    History  Substance Use Topics  . Smoking status: Former Smoker    Quit date: 02/02/2013  . Smokeless tobacco: Never Used  . Alcohol Use: No  lives with his invalid wife Lives at home   Review of Systems  All other systems reviewed and are negative.     Allergies  Review of patient's allergies indicates no known allergies.  Home Medications   Prior to Admission medications   Medication Sig Start Date End Date Taking? Authorizing Provider  amLODipine (NORVASC) 10 MG tablet Take 10 mg by mouth daily.    Historical Provider, MD  glipiZIDE (GLUCOTROL) 5 MG tablet Take 5 mg by mouth daily.    Historical Provider, MD  losartan (COZAAR) 100 MG tablet Take 100 mg by mouth daily.    Historical Provider, MD  metoprolol tartrate (LOPRESSOR) 25 MG tablet Take 25 mg by mouth 2 (two) times daily.    Historical Provider, MD  phenytoin (DILANTIN) 100 MG ER  capsule Take 400 mg by mouth at bedtime.     Historical Provider, MD  simvastatin (ZOCOR) 10 MG tablet Take 10 mg by mouth at bedtime.    Historical Provider, MD   Triage Vitals: BP 142/64  Pulse 62  Temp(Src) 97.9 F (36.6 C) (Oral)  Resp 18  Ht 5\' 10"  (1.778 m)  Wt 124 lb (56.246 kg)  BMI 17.79 kg/m2  SpO2 100%  Vital signs normal   Physical Exam  Nursing note and vitals reviewed. Constitutional: He is oriented to person, place, and time. He appears well-developed and well-nourished.  Non-toxic appearance. He does not appear ill. No distress.  HENT:  Head: Normocephalic and atraumatic.  Right Ear: External ear normal.  Left Ear: External ear normal.  Nose: Nose normal. No mucosal edema or rhinorrhea.  Mouth/Throat: Oropharynx is clear and moist  and mucous membranes are normal. No dental abscesses or uvula swelling.  Large hard mass inferior and posterior to his left ear.   Eyes: EOM are normal. Pupils are equal, round, and reactive to light.  Pale conjunctiva.   Neck: Normal range of motion and full passive range of motion without pain. Neck supple.  Cardiovascular: Normal rate, regular rhythm and normal heart sounds.  Exam reveals no gallop and no friction rub.   No murmur heard. Pulmonary/Chest: Effort normal and breath sounds normal. No respiratory distress. He has no wheezes. He has no rhonchi. He has no rales. He exhibits no tenderness and no crepitus.  Abdominal: Soft. Normal appearance and bowel sounds are normal. He exhibits no distension. There is no tenderness. There is no rebound and no guarding.  Musculoskeletal: Normal range of motion. He exhibits no edema and no tenderness.  Moves all extremities well.   Neurological: He is alert and oriented to person, place, and time. He has normal strength. No cranial nerve deficit.  Skin: Skin is warm, dry and intact. No rash noted. No erythema. No pallor.  Pale.  Psychiatric: He has a normal mood and affect. His speech is normal and behavior is normal. His mood appears not anxious.    ED Course  Procedures (including critical care time)  Medications  0.9 %  sodium chloride infusion (1,000 mLs Intravenous New Bag/Given 10/17/13 1828)    Followed by  0.9 %  sodium chloride infusion (not administered)  cefTRIAXone (ROCEPHIN) 1 g in dextrose 5 % 50 mL IVPB (not administered)    DIAGNOSTIC STUDIES: Oxygen Saturation is 100% on room air, normal by my interpretation.    COORDINATION OF CARE: 5:50 PM-Discussed treatment plan with pt at bedside and pt agreed to plan.  Review of patient's prior records show he started missing his Procrit injections on June 17.  Orthostatic Lying - BP- Lying: 144/58 mmHg ; Pulse- Lying: 59  Orthostatic Sitting - BP- Sitting: 124/61 mmHg ; Pulse-  Sitting: 64  Orthostatic Standing at 0 minutes - BP- Standing at 0 minutes: 95/54 mmHg ; Pulse- Standing at 0 minutes: 66  18:30 Patient was noted to have significant hypotension on standing. He had an IV inserted and was given IV fluids. He was prepared for a blood transfusion. Patient is agreeable to blood transfusion.  18:38 Dr Anastasio Champion, admit to tele, team 2  Patient's UA resulted, he appears to have a possible UTI. Urine culture was sent. Patient started on IV Rocephin.   Labs Review Results for orders placed during the hospital encounter of 10/17/13  CBC WITH DIFFERENTIAL      Result Value Ref Range  WBC 6.1  4.0 - 10.5 K/uL   RBC 1.92 (*) 4.22 - 5.81 MIL/uL   Hemoglobin 6.7 (*) 13.0 - 17.0 g/dL   HCT 19.0 (*) 39.0 - 52.0 %   MCV 99.0  78.0 - 100.0 fL   MCH 34.4 (*) 26.0 - 34.0 pg   MCHC 34.7  30.0 - 36.0 g/dL   RDW 13.4  11.5 - 15.5 %   Platelets 142 (*) 150 - 400 K/uL   Neutrophils Relative % 76  43 - 77 %   Neutro Abs 4.7  1.7 - 7.7 K/uL   Lymphocytes Relative 13  12 - 46 %   Lymphs Abs 0.8  0.7 - 4.0 K/uL   Monocytes Relative 9  3 - 12 %   Monocytes Absolute 0.6  0.1 - 1.0 K/uL   Eosinophils Relative 1  0 - 5 %   Eosinophils Absolute 0.1  0.0 - 0.7 K/uL   Basophils Relative 0  0 - 1 %   Basophils Absolute 0.0  0.0 - 0.1 K/uL  COMPREHENSIVE METABOLIC PANEL      Result Value Ref Range   Sodium 135 (*) 137 - 147 mEq/L   Potassium 4.0  3.7 - 5.3 mEq/L   Chloride 100  96 - 112 mEq/L   CO2 24  19 - 32 mEq/L   Glucose, Bld 147 (*) 70 - 99 mg/dL   BUN 28 (*) 6 - 23 mg/dL   Creatinine, Ser 4.38 (*) 0.50 - 1.35 mg/dL   Calcium 7.7 (*) 8.4 - 10.5 mg/dL   Total Protein 6.0  6.0 - 8.3 g/dL   Albumin 1.8 (*) 3.5 - 5.2 g/dL   AST 18  0 - 37 U/L   ALT 8  0 - 53 U/L   Alkaline Phosphatase 121 (*) 39 - 117 U/L   Total Bilirubin 0.2 (*) 0.3 - 1.2 mg/dL   GFR calc non Af Amer 13 (*) >90 mL/min   GFR calc Af Amer 15 (*) >90 mL/min   Anion gap 11  5 - 15  URINALYSIS, ROUTINE W  REFLEX MICROSCOPIC      Result Value Ref Range   Color, Urine YELLOW  YELLOW   APPearance HAZY (*) CLEAR   Specific Gravity, Urine >1.030 (*) 1.005 - 1.030   pH 5.5  5.0 - 8.0   Glucose, UA 100 (*) NEGATIVE mg/dL   Hgb urine dipstick SMALL (*) NEGATIVE   Bilirubin Urine MODERATE (*) NEGATIVE   Ketones, ur TRACE (*) NEGATIVE mg/dL   Protein, ur >300 (*) NEGATIVE mg/dL   Urobilinogen, UA 0.2  0.0 - 1.0 mg/dL   Nitrite NEGATIVE  NEGATIVE   Leukocytes, UA NEGATIVE  NEGATIVE  URINE MICROSCOPIC-ADD ON      Result Value Ref Range   Squamous Epithelial / LPF FEW (*) RARE   WBC, UA 11-20  <3 WBC/hpf   RBC / HPF 3-6  <3 RBC/hpf   Bacteria, UA FEW (*) RARE   Casts GRANULAR CAST (*) NEGATIVE  CBG MONITORING, ED      Result Value Ref Range   Glucose-Capillary 150 (*) 70 - 99 mg/dL  TYPE AND SCREEN      Result Value Ref Range   ABO/RH(D) A NEG     Antibody Screen PENDING     Sample Expiration 10/20/2013    PREPARE RBC (CROSSMATCH)      Result Value Ref Range   Order Confirmation ORDER PROCESSED BY BLOOD BANK     Hemoccult pending  Laboratory interpretation all normal except some worsening of his chronic renal disease usually in the 3.5 range, some worsening of his chronic anemia which is usually in the 8 range, possible UTI    Imaging Review No results found.   EKG Interpretation None      MDM   Final diagnoses:  Chronic renal disease, unspecified stage  Anemia associated with chronic renal failure, unspecified stage  Orthostatic hypotension  Fall, initial encounter  Urinary tract infection without hematuria, site unspecified   Plan admission   Rolland Porter, MD, FACEP    I personally performed the services described in this documentation, which was scribed in my presence. The recorded information has been reviewed and considered.  Rolland Porter, MD, FACEP    Janice Norrie, MD 10/17/13 [phone]

## 2013-10-17 NOTE — ED Notes (Signed)
CRITICAL VALUE ALERT  Critical value received:  HbG 6.7  Date of notification:  10/17/13  Time of notification:  1748  Critical value read back:Yes.    Nurse who received alert:  RMinter  MD notified (1st page):  Dr. Tomi Bamberger  Time of first page:    MD notified (2nd page):  Time of second page:  Responding MD:  Dr. Tomi Bamberger  Time MD responded:  [phone]

## 2013-10-17 NOTE — H&P (Signed)
[facility] History and Physical  [name] MPN:[id number] DOB: [birth year] DOA: 10/17/2013  Referring physician: ER. PCP: Dionisio Paschal, NP   Chief Complaint: Fall, weakness.  HPI: Mark Sanford is a 63 y.o. male  This is a 63 year old man, who has end-stage renal disease and was awaiting hemodialysis, diabetic, hypertensive, history of CVA who presents with a fall as he was visiting a relative in the hospital. He said he felt weak in his legs gave away. Fortunately, he did not have any head injury or any serious fall resulting in fracture. He did not have loss of consciousness. He was taking Procrit for his anemia secondary to his chronic kidney disease but this has been discontinued. 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 When this diagnosis was made approximately a month ago, apparently he was asked to discontinue several medications, including Procrit. Initial evaluation in the emergency room shows severe anemia with a hemoglobin of 6.7. He is being admitted for further management now.   Review of Systems:  Constitutional:  No weight loss, night sweats, Fevers, chills HEENT:  No headaches, Difficulty swallowing,Tooth/dental problems,Sore throat,  No sneezing, itching, ear ache, nasal congestion, post nasal drip,    GI:  No heartburn, indigestion, abdominal pain, nausea, vomiting, diarrhea, change in bowel habits, loss of appetite  Resp:  No shortness of breath with exertion or at rest. No excess mucus, no productive cough, No non-productive cough, No coughing up of blood.No change in color of mucus.No wheezing.No chest wall deformity  Skin:  no rash or lesions.  GU:  no dysuria, change in color of urine, no urgency or frequency. No flank pain.  Musculoskeletal:  No joint pain or swelling. No decreased range of motion. No back pain.  Psych:  No change in mood or  affect. No depression or anxiety. No memory loss.   Past Medical History  Diagnosis Date  . Diabetes mellitus without complication   . Hypertension   . Chronic anemia   . Stroke   . Seizures   . Cancer   . Renal insufficiency    Past Surgical History  Procedure Laterality Date  . Bascilic vein transposition Left 08/09/2013    Procedure: LEFT 1ST STAGE BRACHIAL VEIN TRANSPOSITION;  Surgeon: Conrad [place], MD;  Location: [facility];  Service: Vascular;  Laterality: Left;   Social History:  reports that he quit smoking about 8 months ago. He has never used smokeless tobacco. He reports that he does not drink alcohol or use illicit drugs.  No Known Allergies  Family History  Problem Relation Age of Onset  . Diabetes Mother   . Hypertension Mother   . Diabetes Father   . Hypertension Father      Prior to Admission medications   Medication Sig Start Date End Date Taking? Authorizing Provider  amLODipine (NORVASC) 10 MG tablet Take 10 mg by mouth daily.   Yes Historical Provider, MD  metoprolol tartrate (LOPRESSOR) 25 MG tablet Take 25 mg by mouth 2 (two) times daily.   Yes Historical Provider, MD  phenytoin (DILANTIN) 100 MG ER capsule Take 300 mg by mouth at bedtime.    Yes Historical Provider, MD  simvastatin (ZOCOR) 10 MG tablet Take 10 mg by mouth at bedtime.   Yes Historical Provider, MD   Physical Exam: Filed Vitals:   10/17/13 1946  BP: 155/79  Pulse: 64  Temp:  Resp: 18    BP 155/79  Pulse 64  Temp(Src) 97.9 F (36.6 C) (Oral)  Resp 18  Ht 5\' 10"  (1.778 m)  Wt 56.246 kg (124 lb)  BMI 17.79 kg/m2  SpO2 100%  General:  Appears pale and also clinically dehydrated. Eyes: PERRL, normal lids, irises & conjunctiva ENT: grossly normal hearing, lips & tongue Neck: no LAD, masses or thyromegaly Cardiovascular: RRR, no m/r/g. No LE edema. Telemetry: SR, no arrhythmias  Respiratory: CTA bilaterally, no w/r/r. Normal respiratory effort. Abdomen: soft, ntnd Skin: no rash  or induration seen on limited exam Musculoskeletal: grossly normal tone BUE/BLE Psychiatric: grossly normal mood and affect, speech fluent and appropriate Neurologic: grossly non-focal.          Labs on Admission:  Basic Metabolic Panel:  Recent Labs Lab 10/17/13 1713  NA 135*  K 4.0  CL 100  CO2 24  GLUCOSE 147*  BUN 28*  CREATININE 4.38*  CALCIUM 7.7*   Liver Function Tests:  Recent Labs Lab 10/17/13 1713  AST 18  ALT 8  ALKPHOS 121*  BILITOT 0.2*  PROT 6.0  ALBUMIN 1.8*     CBC:  Recent Labs Lab 10/17/13 1713  WBC 6.1  NEUTROABS 4.7  HGB 6.7*  HCT 19.0*  MCV 99.0  PLT 142*     CBG:  Recent Labs Lab 10/17/13 1622  GLUCAP 150*    Radiological Exams on Admission: No results found.    Assessment/Plan   1. Severe anemia of chronic kidney disease. Hemoglobin 6.7. No obvious evidence of active bleeding. Recent discontinuation appropriate. 2. Stage IV chronic disease/end-stage renal disease. Awaiting hemodialysis. Clinically dehydrated. 3. 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 4. Hypertension. 5. Type 2 diabetes mellitus, currently not on any oral hypoglycemic agents.  Plan: 1. Admit to medical floor. 2. 2 units blood transfusion. IV fluids. 3. Nephrology consultation. 4. Monitor and control diabetes.  Further recommendations will depend on patient's hospital progress.   Code Status: Full code.  Family Communication: I discussed the plan with patient at the bedside.   Disposition Plan: Home when medically stable.   Time spent: 60 minutes.  Doree Albee [facility] Pager [phone].  **Disclaimer: This note may have been dictated with voice recognition software. Similar sounding words can inadvertently be transcribed and this note may contain transcription errors which may not have been corrected upon publication of note.**

## 2013-10-17 NOTE — ED Notes (Signed)
Pt fell in parking lot , was here to visit.  , Swelling to lt side of neck due to cancer.

## 2013-10-18 DIAGNOSIS — N184 Chronic kidney disease, stage 4 (severe): Secondary | ICD-10-CM

## 2013-10-18 DIAGNOSIS — N390 Urinary tract infection, site not specified: Secondary | ICD-10-CM | POA: Diagnosis present

## 2013-10-18 LAB — COMPREHENSIVE METABOLIC PANEL
ALT: 7 U/L (ref 0–53)
AST: 13 U/L (ref 0–37)
Albumin: 1.5 g/dL — ABNORMAL LOW (ref 3.5–5.2)
Alkaline Phosphatase: 106 U/L (ref 39–117)
Anion gap: 12 (ref 5–15)
BUN: 26 mg/dL — ABNORMAL HIGH (ref 6–23)
CALCIUM: 7.1 mg/dL — AB (ref 8.4–10.5)
CO2: 22 mEq/L (ref 19–32)
Chloride: 104 mEq/L (ref 96–112)
Creatinine, Ser: 4.03 mg/dL — ABNORMAL HIGH (ref 0.50–1.35)
GFR calc Af Amer: 17 mL/min — ABNORMAL LOW (ref >90)
GFR calc non Af Amer: 15 mL/min — ABNORMAL LOW (ref >90)
Glucose, Bld: 110 mg/dL — ABNORMAL HIGH (ref 70–99)
Potassium: 3.4 mEq/L — ABNORMAL LOW (ref 3.7–5.3)
SODIUM: 138 meq/L (ref 137–147)
TOTAL PROTEIN: 5.2 g/dL — AB (ref 6.0–8.3)
Total Bilirubin: 0.2 mg/dL — ABNORMAL LOW (ref 0.3–1.2)

## 2013-10-18 LAB — CBC
HCT: 27.3 % — ABNORMAL LOW (ref 39.0–52.0)
Hemoglobin: 9.7 g/dL — ABNORMAL LOW (ref 13.0–17.0)
MCH: 32.2 pg (ref 26.0–34.0)
MCHC: 35.5 g/dL (ref 30.0–36.0)
MCV: 90.7 fL (ref 78.0–100.0)
Platelets: 122 10*3/uL — ABNORMAL LOW (ref 150–400)
RBC: 3.01 MIL/uL — AB (ref 4.22–5.81)
RDW: 18.1 % — ABNORMAL HIGH (ref 11.5–15.5)
WBC: 7.6 10*3/uL (ref 4.0–10.5)

## 2013-10-18 LAB — TYPE AND SCREEN
ABO/RH(D): A NEG
Antibody Screen: NEGATIVE
Unit division: 0
Unit division: 0

## 2013-10-18 LAB — GLUCOSE, CAPILLARY
Glucose-Capillary: 95 mg/dL (ref 70–99)
Glucose-Capillary: 154 mg/dL — ABNORMAL HIGH (ref 70–99)

## 2013-10-18 MED ORDER — POTASSIUM CHLORIDE CRYS ER 20 MEQ PO TBCR
40.0000 meq | EXTENDED_RELEASE_TABLET | Freq: Once | ORAL | Status: AC
  Administered 2013-10-18: 40 meq via ORAL
  Filled 2013-10-18: qty 2

## 2013-10-18 MED ORDER — CIPROFLOXACIN HCL 250 MG PO TABS: 500.0000 mg | ORAL_TABLET | Freq: Two times a day (BID) | ORAL | Status: DC

## 2013-10-18 MED ORDER — CIPROFLOXACIN HCL 500 MG PO TABS: 500.0000 mg | ORAL_TABLET | Freq: Two times a day (BID) | ORAL | Status: DC

## 2013-10-18 NOTE — Discharge Summary (Signed)
Physician Discharge Summary  Mark Sanford IEP:[id number] DOB: 23-Jul-1950 DOA: 10/17/2013  PCP: Dionisio Paschal, NP  Admit date: 10/17/2013 Discharge date: 10/18/2013  Time spent: 40 minutes  Recommendations for Outpatient Follow-up:  1. Has appointment with oncology at [facility] 10/20/13. Recommend CBC for tracking of Hg.  2. PCP at [facility] health department in 1-2 weeks for evaluation of anemia and DM control  Discharge Diagnoses:  Principal Problem:   Anemia associated with chronic renal failure Active Problems:   DM w/o complication type II   HYPERTENSION   Acute on chronic renal failure   Chronic kidney disease, stage IV (severe)   Anemia   UTI (urinary tract infection)   Discharge Condition: stable  Diet recommendation: carb modified  Filed Weights   10/17/13 1615 10/17/13 2104  Weight: 56.246 kg (124 lb) 57.425 kg (126 lb 9.6 oz)    History of present illness:  This is a 63 year old man, with end-stage renal disease awaiting hemodialysis, diabetic, hypertensive, history of CVA who presented with a fall as he was visiting a relative in the hospital. He said he felt weak and his legs gave away. He did not have any head injury or any serious fall resulting in fracture. He did not have loss of consciousness. He reported taking Procrit for his anemia secondary to his chronic kidney disease but this has been discontinued. 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 When this diagnosis was made approximately a month ago, apparently he was asked to discontinue several medications, including Procrit. Initial evaluation in the emergency room showed severe anemia with a hemoglobin of 6.7. He was admitted for further management.   Hospital Course:  1.  acute on chronic anemia of chronic kidney disease. Likely related to discontinuation of Procrit. No hx to suggest GI blood loss.   Hemoglobin 6.7 on admission. S/p 2 units PRBC's. Hg 9.7 at discharge. No obvious evidence of active bleeding. At discharge BP stable. Ambulating in hall with steady gait. Recommend follow up with PCP 1 week with CBC for tracking of Hg.  2. Stage IV chronic disease/end-stage renal disease not yet on dialysis. Creatinine stable, potassium 3.4. Follow up with nephrology OP 3. 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 Has appointment 10/20/13 with oncology at [facility] to begin treatment 4. Hypertension. Orthostatic on admission. Resolved at discharge. See #1 5. Type 2 diabetes mellitus, currently not on any oral hypoglycemic agents. CBG range 96-150. Recommend close OP follow up with PCP for optimal control 6. UTI: will discharge with 3 days cipro. Will follow culture as indicated 7. Thrombocytopenia: mild. Stable. OP follow up.      Procedures:  none  Consultations:  none  Discharge Exam: Filed Vitals:   10/18/13 1415  BP: 131/58  Pulse: 59  Temp: 97.2 F (36.2 C)  Resp: 16    General: thin appears well, smiling Cardiovascular: RRR No MGR No LE edema Respiratory: normal effort BS clear bilaterally to ausculation. No wheeze no rhonchi Abdomen: flat soft +BS non-tender to palpation   Discharge Instructions You were cared for by a hospitalist during your hospital stay. If you have any questions about your discharge medications or the care you received while you were in the hospital after you are discharged, you can call the unit and asked to speak with the hospitalist on call if the  hospitalist that took care of you is not available. Once you are discharged, your primary care physician will handle any further medical issues. Please note that NO REFILLS for any discharge medications will be authorized once you are discharged, as it is imperative that you return to your primary care physician (or  establish a relationship with a primary care physician if you do not have one) for your aftercare needs so that they can reassess your need for medications and monitor your lab values.      Discharge Instructions   Diet - low sodium heart healthy    Complete by:  As directed      Increase activity slowly    Complete by:  As directed             Medication List         amLODipine 10 MG tablet  Commonly known as:  NORVASC  Take 10 mg by mouth daily.     ciprofloxacin 500 MG tablet  Commonly known as:  CIPRO  Take 1 tablet (500 mg total) by mouth 2 (two) times daily.     metoprolol tartrate 25 MG tablet  Commonly known as:  LOPRESSOR  Take 25 mg by mouth 2 (two) times daily.     phenytoin 100 MG ER capsule  Commonly known as:  DILANTIN  Take 300 mg by mouth at bedtime.     simvastatin 10 MG tablet  Commonly known as:  ZOCOR  Take 10 mg by mouth at bedtime.       No Known Allergies Follow-up Information   Follow up with patient has appointment with oncology 10/20/13. recommend CBC for trending of Hg. Schedule an appointment as soon as possible for a visit in 1 week. (evaluation of hemaglobin.)        The results of significant diagnostics from this hospitalization (including imaging, microbiology, ancillary and laboratory) are listed below for reference.    Significant Diagnostic Studies: No results found.  Microbiology: No results found for this or any previous visit (from the past 240 hour(s)).   Labs: Basic Metabolic Panel:  Recent Labs Lab 10/17/13 1713 10/18/13 0556  NA 135* 138  K 4.0 3.4*  CL 100 104  CO2 24 22  GLUCOSE 147* 110*  BUN 28* 26*  CREATININE 4.38* 4.03*  CALCIUM 7.7* 7.1*   Liver Function Tests:  Recent Labs Lab 10/17/13 1713 10/18/13 0556  AST 18 13  ALT 8 7  ALKPHOS 121* 106  BILITOT 0.2* 0.2*  PROT 6.0 5.2*  ALBUMIN 1.8* 1.5*   No results found for this basename: LIPASE, AMYLASE,  in the last 168 hours No results  found for this basename: AMMONIA,  in the last 168 hours CBC:  Recent Labs Lab 10/17/13 1713 10/18/13 0556  WBC 6.1 7.6  NEUTROABS 4.7  --   HGB 6.7* 9.7*  HCT 19.0* 27.3*  MCV 99.0 90.7  PLT 142* 122*   Cardiac Enzymes: No results found for this basename: CKTOTAL, CKMB, CKMBINDEX, TROPONINI,  in the last 168 hours BNP: BNP (last 3 results) No results found for this basename: PROBNP,  in the last 8760 hours CBG:  Recent Labs Lab 10/17/13 1622 10/17/13 2044 10/18/13 0717 10/18/13 1115  GLUCAP 150* 166* 95 154*       Signed:  Mccoy Testa M  [facility] 10/18/2013, 2:52 PM

## 2013-10-18 NOTE — Discharge Summary (Signed)
Patient seen, independently examined and chart reviewed. I agree with exam, assessment and plan discussed with Dyanne Carrel, NP.  PCP [facility] Department  63 year old male with history of end-stage renal disease not yet on hemodialysis who presented to the emergency department after a fall when his legs gave way. No head injury or evidence of serious injury, no loss of consciousness. Previously took Procrit for chronic anemia secondary to kidney disease but he stopped this medication approximately one month ago and he was diagnosed with squamous cell carcinoma of the palate. Workup in the emergency department revealed profound orthostasis and anemia with hemoglobin 6.7. Observation for transfusion was recommended. He was admitted for severe anemia of chronic kidney disease.  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 He has been up and walking without difficulty. Discussed with RN, patient ambulated well without gait imbalance or signs or symptoms of orthostasis.  He does report he just felt generally weak yesterday. No bleeding.  Objective: Afebrile, vital signs stable. No hypoxia.  Gen. Appears vigorous, calm and comfortable.  Psych. Grossly normal mood and affect. Grossly normal mentation. Speech fluent and clear.  Cardiovascular. Regular rate and rhythm. No murmur, rub or gallop. No lower extremity edema.  Respiratory. Clear to auscultation bilaterally. No wheezes, rales or rhonchi. Normal respiratory effort.  Musculoskeletal. Grossly normal tone and strength all extremities.  Neurologic. Grossly nonfocal.     I/O: Urine output 450  Chemistry: Creatinine appears to be stable with some improvement,  4.03. Potassium 3.4. Hepatic function panel unremarkable.  Heme: Hemoglobin 6.7 >> 9.7 status post 2 units packed red blood cells. Baseline appears to be 8-9.  ID: Urinalysis equivocal. Culture pending.  Imaging: Chest x-ray without acute abnormalities but did show a bony exit ptosis of unclear etiology.     Much better today with appropriate response to transfusion 2 units packed red blood cells. No history of bleeding. His history, symptomology most suggestive with acute on chronic anemia of chronic disease, secondary to recent discontinuation of Procrit with concomitant orthostasis. He has no history to suggest GI blood loss.  Orthostatic hypotension. Resolved. Symptomatically resolved. Ambulating in the hallway without difficulty. No symptoms.  Possible UTI. Followup culture, favor a short course (3 days of antibiotic total)  Mild thrombocytopenia, appears to be stable, seen 6/15. Followup as an outpatient.  I discussed with patient as well as daughter the importance of followup as planned at [facility] in 48 hours. He also needs to have his CBC followed regularly either by his cancer physician, nephrologist or primary care provider. He may need intermittent transfusion until Procrit can be resumed.  Murray Hodgkins, MD [facility] [phone]

## 2013-10-18 NOTE — Care Management Note (Signed)
    Page 1 of 1   10/18/2013     4:47:08 PM CARE MANAGEMENT NOTE 10/18/2013  Patient:  Mark Sanford, Mark Sanford   Account Number:  [id number]  Date Initiated:  10/18/2013  Documentation initiated by:  Vladimir Creeks  Subjective/Objective Assessment:   Admitted with anemia. Pt is from home, lives with his girlfriend,  is independent, and will return home at D/C.     Action/Plan:   he is diabetic and says he does not have a machine to check his sugar. Instructed to ask at the HD where he goes for care, and they should be able to get  one for him   Anticipated DC Date:  10/18/2013   Anticipated DC Plan:  [place]  CM consult      Choice offered to / List presented to:             Status of service:  Completed, signed off Medicare Important Message given?   (If response is "NO", the following Medicare IM given date fields will be blank) Date Medicare IM given:   Medicare IM given by:   Date Additional Medicare IM given:   Additional Medicare IM given by:    Discharge Disposition:  HOME/SELF CARE  Per UR Regulation:  Reviewed for med. necessity/level of care/duration of stay  If discussed at [place] of Stay Meetings, dates discussed:    Comments:  10/18/13/1300 Vladimir Creeks RN/CM

## 2013-10-18 NOTE — Care Management Utilization Note (Signed)
UR completed 

## 2013-10-18 NOTE — Progress Notes (Signed)
Patient with orders to be discharge home. Discharge instructions given, patient verbalized understanding. Patient stable. Patient left in private vehicle with family.  

## 2013-10-20 ENCOUNTER — Telehealth: Payer: Self-pay | Admitting: Internal Medicine

## 2013-10-20 LAB — URINE CULTURE: Colony Count: >=100000

## 2013-10-23 ENCOUNTER — Other Ambulatory Visit (HOSPITAL_COMMUNITY): Payer: Self-pay | Admitting: Internal Medicine

## 2013-10-23 NOTE — Progress Notes (Signed)
Spoke with pt's niece, Darlys Gales and she states she was not aware of this surgery being scheduled. States pt has to have surgery on his throat at [facility] before he can have this surgery. She states that she will call Dr. Lianne Moris office today and talk with them. I called and spoke with Zigmund Daniel at Dr. Lianne Moris office to notify them that pt was not planning to have the surgery. She states she will talk with pt's niece.

## 2013-10-24 ENCOUNTER — Encounter (HOSPITAL_COMMUNITY): Admission: RE | Payer: Self-pay | Source: Ambulatory Visit

## 2013-10-24 ENCOUNTER — Ambulatory Visit (HOSPITAL_COMMUNITY): Admission: RE | Admit: 2013-10-24 | Payer: Medicaid Other | Source: Ambulatory Visit | Admitting: Vascular Surgery

## 2013-10-24 SURGERY — TRANSPOSITION, VEIN, BASILIC
Anesthesia: Choice | Laterality: Left

## 2013-11-15 ENCOUNTER — Other Ambulatory Visit (HOSPITAL_COMMUNITY): Payer: Self-pay | Admitting: *Deleted

## 2013-11-15 DIAGNOSIS — R221 Localized swelling, mass and lump, neck: Secondary | ICD-10-CM | POA: Insufficient documentation

## 2013-11-16 ENCOUNTER — Encounter (HOSPITAL_COMMUNITY): Payer: Medicaid Other

## 2013-11-16 ENCOUNTER — Encounter (HOSPITAL_COMMUNITY): Payer: Self-pay

## 2013-11-16 ENCOUNTER — Encounter (HOSPITAL_COMMUNITY): Payer: Medicaid Other | Attending: Nephrology

## 2013-11-16 VITALS — BP 159/67 | HR 87 | Temp 97.6°F | Resp 18 | Ht 70.5 in | Wt 111.8 lb

## 2013-11-16 DIAGNOSIS — I2510 Atherosclerotic heart disease of native coronary artery without angina pectoris: Secondary | ICD-10-CM

## 2013-11-16 DIAGNOSIS — R221 Localized swelling, mass and lump, neck: Secondary | ICD-10-CM

## 2013-11-16 DIAGNOSIS — I209 Angina pectoris, unspecified: Secondary | ICD-10-CM

## 2013-11-16 DIAGNOSIS — J438 Other emphysema: Secondary | ICD-10-CM

## 2013-11-16 DIAGNOSIS — C051 Malignant neoplasm of soft palate: Secondary | ICD-10-CM

## 2013-11-16 DIAGNOSIS — C8581 Other specified types of non-Hodgkin lymphoma, lymph nodes of head, face, and neck: Secondary | ICD-10-CM | POA: Insufficient documentation

## 2013-11-16 DIAGNOSIS — R220 Localized swelling, mass and lump, head: Secondary | ICD-10-CM | POA: Diagnosis present

## 2013-11-16 DIAGNOSIS — I10 Essential (primary) hypertension: Secondary | ICD-10-CM

## 2013-11-16 DIAGNOSIS — I25119 Atherosclerotic heart disease of native coronary artery with unspecified angina pectoris: Secondary | ICD-10-CM

## 2013-11-16 DIAGNOSIS — I739 Peripheral vascular disease, unspecified: Secondary | ICD-10-CM

## 2013-11-16 LAB — COMPREHENSIVE METABOLIC PANEL
ALK PHOS: 131 U/L — AB (ref 39–117)
ALT: 5 U/L (ref 0–53)
AST: 17 U/L (ref 0–37)
Albumin: 1.8 g/dL — ABNORMAL LOW (ref 3.5–5.2)
Anion gap: 12 (ref 5–15)
BILIRUBIN TOTAL: 0.2 mg/dL — AB (ref 0.3–1.2)
BUN: 26 mg/dL — ABNORMAL HIGH (ref 6–23)
CHLORIDE: 101 meq/L (ref 96–112)
CO2: 22 mEq/L (ref 19–32)
Calcium: 8.1 mg/dL — ABNORMAL LOW (ref 8.4–10.5)
Creatinine, Ser: 4.1 mg/dL — ABNORMAL HIGH (ref 0.50–1.35)
GFR, EST AFRICAN AMERICAN: 17 mL/min — AB (ref >90)
GFR, EST NON AFRICAN AMERICAN: 14 mL/min — AB (ref >90)
GLUCOSE: 124 mg/dL — AB (ref 70–99)
Potassium: 4 mEq/L (ref 3.7–5.3)
SODIUM: 135 meq/L — AB (ref 137–147)
Total Protein: 6.2 g/dL (ref 6.0–8.3)

## 2013-11-16 LAB — CBC WITH DIFFERENTIAL/PLATELET
BASOS ABS: 0 10*3/uL (ref 0.0–0.1)
Basophils Relative: 0 % (ref 0–1)
EOS PCT: 1 % (ref 0–5)
Eosinophils Absolute: 0.1 10*3/uL (ref 0.0–0.7)
HEMATOCRIT: 22.7 % — AB (ref 39.0–52.0)
Hemoglobin: 7.9 g/dL — ABNORMAL LOW (ref 13.0–17.0)
LYMPHS ABS: 1.2 10*3/uL (ref 0.7–4.0)
LYMPHS PCT: 14 % (ref 12–46)
MCH: 32 pg (ref 26.0–34.0)
MCHC: 34.8 g/dL (ref 30.0–36.0)
MCV: 91.9 fL (ref 78.0–100.0)
Monocytes Absolute: 0.6 10*3/uL (ref 0.1–1.0)
Monocytes Relative: 7 % (ref 3–12)
Neutro Abs: 6.7 10*3/uL (ref 1.7–7.7)
Neutrophils Relative %: 78 % — ABNORMAL HIGH (ref 43–77)
PLATELETS: 166 10*3/uL (ref 150–400)
RBC: 2.47 MIL/uL — AB (ref 4.22–5.81)
RDW: 16.6 % — AB (ref 11.5–15.5)
WBC: 8.6 10*3/uL (ref 4.0–10.5)

## 2013-11-16 LAB — LACTATE DEHYDROGENASE: LDH: 341 U/L — ABNORMAL HIGH (ref 94–250)

## 2013-11-16 MED ORDER — FLUTICASONE PROPIONATE 50 MCG/ACT NA SUSP: NASAL | Status: DC

## 2013-11-16 MED ORDER — OXYCODONE HCL 5 MG/5ML PO SOLN: ORAL | Status: DC

## 2013-11-16 NOTE — Progress Notes (Signed)
Palmas A. Barnet Glasgow, M.D.  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     HISTORY OF PRESENT ILLNESS:Mark Sanford is a 63 y.o. 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 He complains of pain on the left side of his neck with a mass that has gradually increased in size over the past 6 weeks. He denies any dysphagia, oral bleeding, but does have nasal stuffiness without PND, orthopnea, or palpitations. He is known to have stage IV chronic kidney disease with GFR less than 10 and has been smoking to about hemodialysis. He denies any significant cough or wheezing he also denies any lower extremity swelling or redness, abdominal pain, constipation, melena, hematochezia, hematuria, skin rash, or recent seizures. He had smoked a lot in the past as well as drunk alcohol. He was evaluated at [facility] and was never referred here to [facility] for definitive intervention.   PAST MEDICAL HISTORY:  has a past medical history of Diabetes mellitus without complication; Hypertension; Chronic anemia; Stroke; Seizures; Cancer; and Renal insufficiency.     PAST SURGICAL HISTORY: Past Surgical History  Procedure Laterality Date  . Bascilic vein transposition Left 08/09/2013    Procedure: LEFT 1ST STAGE BRACHIAL VEIN TRANSPOSITION;  Surgeon: Conrad Hubbard, MD;  Location: [facility];  Service: Vascular;  Laterality: Left;     CURRENT MEDICATIONS: has a current medication list which includes the following prescription(s): amlodipine, metoprolol tartrate, phenytoin, simvastatin,  fluticasone, and oxycodone.   ALLERGIES: Review of patient's allergies indicates no known allergies.   SOCIAL HISTORY:  reports that he quit smoking about 9 months ago. He has never used smokeless tobacco. He reports that he does not drink alcohol or use illicit drugs.   FAMILY HISTORY: family history includes Diabetes in his father and mother; Hypertension in his father and mother.    REVIEW OF SYSTEMS:  Other than that discussed above is noncontributory.    PHYSICAL EXAM:  height is 5' 10.5" (1.791 m) and weight is 111 lb 12.8 oz (50.712 kg). His oral temperature is 97.6 F (36.4 C). His blood pressure is 159/67 and his pulse is 87. His respiration is 18.    GENERAL:alert, moderate distress her left neck pain, pale. SKIN: skin color, texture, turgor are normal, no rashes or significant lesions EYES: normal, Conjunctiva are pink and non-injected, sclera clear OROPHARYNX: Mass replacing the soft palate measuring approximately 4 cm in size NECK: Left neck mass measuring 9 cm in size in the posterior cervical chain with fixation to the underlying tissue. Right posterior cervical lymph nodes measuring 2-3 cm, nontender. CHEST: Increased AP diameter with no breast masses. LYMPH:  no palpable lymphadenopathy in the cervical, axillary or inguinal LUNGS: clear to auscultation and percussion with normal breathing effort HEART: regular rate & rhythm and no murmurs ABDOMEN:abdomen soft, non-tender and normal bowel sounds. No hepatomegaly, ascites, or CVA tenderness. MUSCULOSKELETALl:no cyanosis of digits, no clubbing or edema  NEURO: alert & oriented x 3 with fluent speech, no focal motor/sensory deficits    LABORATORY DATA:  Appointment on 11/16/2013  Component Date Value Ref  Range Status  . WBC 11/16/2013 8.6  4.0 - 10.5 K/uL Final  . RBC 11/16/2013 2.47* 4.22 - 5.81 MIL/uL Final  . Hemoglobin 11/16/2013 7.9* 13.0 - 17.0 g/dL Final  . HCT 11/16/2013 22.7* 39.0 - 52.0 % Final  . MCV  11/16/2013 91.9  78.0 - 100.0 fL Final  . MCH 11/16/2013 32.0  26.0 - 34.0 pg Final  . MCHC 11/16/2013 34.8  30.0 - 36.0 g/dL Final  . RDW 11/16/2013 16.6* 11.5 - 15.5 % Final  . Platelets 11/16/2013 166  150 - 400 K/uL Final  . Neutrophils Relative % 11/16/2013 78* 43 - 77 % Final  . Neutro Abs 11/16/2013 6.7  1.7 - 7.7 K/uL Final  . Lymphocytes Relative 11/16/2013 14  12 - 46 % Final  . Lymphs Abs 11/16/2013 1.2  0.7 - 4.0 K/uL Final  . Monocytes Relative 11/16/2013 7  3 - 12 % Final  . Monocytes Absolute 11/16/2013 0.6  0.1 - 1.0 K/uL Final  . Eosinophils Relative 11/16/2013 1  0 - 5 % Final  . Eosinophils Absolute 11/16/2013 0.1  0.0 - 0.7 K/uL Final  . Basophils Relative 11/16/2013 0  0 - 1 % Final  . Basophils Absolute 11/16/2013 0.0  0.0 - 0.1 K/uL Final  Admission on 10/17/2013, Discharged on 10/18/2013  Component Date Value Ref Range Status  . Glucose-Capillary 10/17/2013 150* 70 - 99 mg/dL Final  . WBC 10/17/2013 6.1  4.0 - 10.5 K/uL Final  . RBC 10/17/2013 1.92* 4.22 - 5.81 MIL/uL Final  . Hemoglobin 10/17/2013 6.7* 13.0 - 17.0 g/dL Final   Comment: RESULT REPEATED AND VERIFIED                          CRITICAL RESULT CALLED TO, READ BACK BY AND VERIFIED WITH:                          MINTER,B AT 1747 BY HUFFINES,S ON 10/17/13.  Marland Kitchen HCT 10/17/2013 19.0* 39.0 - 52.0 % Final  . MCV 10/17/2013 99.0  78.0 - 100.0 fL Final  . MCH 10/17/2013 34.4* 26.0 - 34.0 pg Final  . MCHC 10/17/2013 34.7  30.0 - 36.0 g/dL Final  . RDW 10/17/2013 13.4  11.5 - 15.5 % Final  . Platelets 10/17/2013 142* 150 - 400 K/uL Final  . Neutrophils Relative % 10/17/2013 76  43 - 77 % Final  . Neutro Abs 10/17/2013 4.7  1.7 - 7.7 K/uL Final  . Lymphocytes Relative 10/17/2013 13  12 - 46 % Final  . Lymphs Abs 10/17/2013 0.8  0.7 - 4.0 K/uL Final  . Monocytes Relative 10/17/2013 9  3 - 12 % Final  . Monocytes Absolute 10/17/2013 0.6  0.1 - 1.0 K/uL Final  . Eosinophils Relative 10/17/2013 1  0 - 5 % Final    . Eosinophils Absolute 10/17/2013 0.1  0.0 - 0.7 K/uL Final  . Basophils Relative 10/17/2013 0  0 - 1 % Final  . Basophils Absolute 10/17/2013 0.0  0.0 - 0.1 K/uL Final  . Sodium 10/17/2013 135* 137 - 147 mEq/L Final  . Potassium 10/17/2013 4.0  3.7 - 5.3 mEq/L Final  . Chloride 10/17/2013 100  96 - 112 mEq/L Final  . CO2 10/17/2013 24  19 - 32 mEq/L Final  . Glucose, Bld 10/17/2013 147* 70 - 99 mg/dL Final  . BUN 10/17/2013 28* 6 - 23 mg/dL Final  . Creatinine,  Ser 10/17/2013 4.38* 0.50 - 1.35 mg/dL Final  . Calcium 10/17/2013 7.7* 8.4 - 10.5 mg/dL Final  . Total Protein 10/17/2013 6.0  6.0 - 8.3 g/dL Final  . Albumin 10/17/2013 1.8* 3.5 - 5.2 g/dL Final  . AST 10/17/2013 18  0 - 37 U/L Final  . ALT 10/17/2013 8  0 - 53 U/L Final  . Alkaline Phosphatase 10/17/2013 121* 39 - 117 U/L Final  . Total Bilirubin 10/17/2013 0.2* 0.3 - 1.2 mg/dL Final  . GFR calc non Af Amer 10/17/2013 13* >90 mL/min Final  . GFR calc Af Amer 10/17/2013 15* >90 mL/min Final   Comment: (NOTE)                          The eGFR has been calculated using the CKD EPI equation.                          This calculation has not been validated in all clinical situations.                          eGFR's persistently <90 mL/min signify possible Chronic Kidney                          Disease.  . Anion gap 10/17/2013 11  5 - 15 Final  . Color, Urine 10/17/2013 YELLOW  YELLOW Final  . APPearance 10/17/2013 HAZY* CLEAR Final  . Specific Gravity, Urine 10/17/2013 >1.030* 1.005 - 1.030 Final  . pH 10/17/2013 5.5  5.0 - 8.0 Final  . Glucose, UA 10/17/2013 100* NEGATIVE mg/dL Final  . Hgb urine dipstick 10/17/2013 SMALL* NEGATIVE Final  . Bilirubin Urine 10/17/2013 MODERATE* NEGATIVE Final  . Ketones, ur 10/17/2013 TRACE* NEGATIVE mg/dL Final  . Protein, ur 10/17/2013 >300* NEGATIVE mg/dL Final  . Urobilinogen, UA 10/17/2013 0.2  0.0 - 1.0 mg/dL Final  . Nitrite 10/17/2013 NEGATIVE  NEGATIVE Final  . Leukocytes, UA  10/17/2013 NEGATIVE  NEGATIVE Final  . ABO/RH(D) 10/17/2013 A NEG   Final  . Antibody Screen 10/17/2013 NEG   Final  . Sample Expiration 10/17/2013 10/20/2013   Final  . Unit Number 10/17/2013 [id number]   Final  . Blood Component Type 10/17/2013 RED CELLS,LR   Final  . Unit division 10/17/2013 00   Final  . Status of Unit 10/17/2013 ISSUED,FINAL   Final  . Transfusion Status 10/17/2013 OK TO TRANSFUSE   Final  . Crossmatch Result 10/17/2013 Compatible   Final  . Unit Number 10/17/2013 [id number]   Final  . Blood Component Type 10/17/2013 RBC LR PHER2   Final  . Unit division 10/17/2013 00   Final  . Status of Unit 10/17/2013 ISSUED,FINAL   Final  . Transfusion Status 10/17/2013 OK TO TRANSFUSE   Final  . Crossmatch Result 10/17/2013 Compatible   Final  . Order Confirmation 10/17/2013 ORDER PROCESSED BY BLOOD BANK   Final  . Squamous Epithelial / LPF 10/17/2013 FEW* RARE Final  . WBC, UA 10/17/2013 11-20  <3 WBC/hpf Final  . RBC / HPF 10/17/2013 3-6  <3 RBC/hpf Final  . Bacteria, UA 10/17/2013 FEW* RARE Final  . Casts 10/17/2013 GRANULAR CAST* NEGATIVE Final  . Specimen Description 10/17/2013 URINE, CLEAN CATCH   Final  . Special Requests 10/17/2013 NONE   Final  . Culture  Setup Time 10/17/2013    Final  Value:10/18/2013 14:25                         Performed at [facility]  . Colony Count 10/17/2013    Final                   Value:>=100,000 COLONIES/ML                         Performed at [facility]  . Culture 10/17/2013    Final                   Value:ENTEROCOCCUS SPECIES                         Performed at [facility]  . Report Status 10/17/2013 10/20/2013 FINAL   Final  . Organism ID, Bacteria 10/17/2013 ENTEROCOCCUS SPECIES   Final  . Sodium 10/18/2013 138  137 - 147 mEq/L Final  . Potassium 10/18/2013 3.4* 3.7 - 5.3 mEq/L Final  . Chloride 10/18/2013 104  96 - 112 mEq/L Final  . CO2 10/18/2013 22  19 - 32 mEq/L Final    . Glucose, Bld 10/18/2013 110* 70 - 99 mg/dL Final  . BUN 10/18/2013 26* 6 - 23 mg/dL Final  . Creatinine, Ser 10/18/2013 4.03* 0.50 - 1.35 mg/dL Final  . Calcium 10/18/2013 7.1* 8.4 - 10.5 mg/dL Final  . Total Protein 10/18/2013 5.2* 6.0 - 8.3 g/dL Final  . Albumin 10/18/2013 1.5* 3.5 - 5.2 g/dL Final  . AST 10/18/2013 13  0 - 37 U/L Final  . ALT 10/18/2013 7  0 - 53 U/L Final  . Alkaline Phosphatase 10/18/2013 106  39 - 117 U/L Final  . Total Bilirubin 10/18/2013 0.2* 0.3 - 1.2 mg/dL Final  . GFR calc non Af Amer 10/18/2013 15* >90 mL/min Final  . GFR calc Af Amer 10/18/2013 17* >90 mL/min Final   Comment: (NOTE)                          The eGFR has been calculated using the CKD EPI equation.                          This calculation has not been validated in all clinical situations.                          eGFR's persistently <90 mL/min signify possible Chronic Kidney                          Disease.  . Anion gap 10/18/2013 12  5 - 15 Final  . WBC 10/18/2013 7.6  4.0 - 10.5 K/uL Final  . RBC 10/18/2013 3.01* 4.22 - 5.81 MIL/uL Final  . Hemoglobin 10/18/2013 9.7* 13.0 - 17.0 g/dL Final   DELTA CHECK NOTED  . HCT 10/18/2013 27.3* 39.0 - 52.0 % Final  . MCV 10/18/2013 90.7  78.0 - 100.0 fL Final   DELTA CHECK NOTED  . MCH 10/18/2013 32.2  26.0 - 34.0 pg Final  . MCHC 10/18/2013 35.5  30.0 - 36.0 g/dL Final  . RDW 10/18/2013 18.1* 11.5 - 15.5 % Final  . Platelets 10/18/2013 122* 150 - 400 K/uL Final  . Glucose-Capillary 10/17/2013 166* 70 - 99 mg/dL  Final  . Glucose-Capillary 10/18/2013 95  70 - 99 mg/dL Final  . Comment 1 10/18/2013 Documented in Chart   Final  . Comment 2 10/18/2013 Notify RN   Final  . Glucose-Capillary 10/18/2013 154* 70 - 99 mg/dL Final  . Comment 1 10/18/2013 Documented in Chart   Final  . Comment 2 10/18/2013 Notify RN   Final    Urinalysis    Component Value Date/Time   COLORURINE YELLOW 10/17/2013 1830   APPEARANCEUR HAZY* 10/17/2013 1830   LABSPEC  >1.030* 10/17/2013 1830   PHURINE 5.5 10/17/2013 1830   GLUCOSEU 100* 10/17/2013 1830   HGBUR SMALL* 10/17/2013 1830   BILIRUBINUR MODERATE* 10/17/2013 1830   KETONESUR TRACE* 10/17/2013 1830   PROTEINUR >300* 10/17/2013 1830   UROBILINOGEN 0.2 10/17/2013 1830   NITRITE NEGATIVE 10/17/2013 1830   LEUKOCYTESUR NEGATIVE 10/17/2013 1830      @RADIOGRAPH      PET HEAD AND NECK CANCER (W/LOW DOSE CT)11/02/2013  [facility]  Result Impression   Hypermetabolic 4.4 x 3.0 cm palatal lesion with regional bilateral enlarged necrotic and hypermetabolic lymphadenopathy as above.  Free fluid in the anatomic pelvis is abnormal, but nonspecific.  Small pericardial effusion.  Atherosclerotic disease, including the coronary arteries.   Result Narrative  PET HEAD AND NECK CANCER (W/LOW DOSE CT), Nov 02, 2013 02:36:37 PM . INDICATION:  Cancer of soft palate ([place]) (Primary Dx); Oropharynx cancer145.3 Malignant neopl  COMPARISON: None  TECHNIQUE:  Approximately 60 minutes after the intravenous injection of 16.752 mCi F18-FDG, images were obtained from the skull base through the mid thighs. These images were attenuation corrected using CT. Standardized uptake values (SUV) were calculated using a lean body mass algorithm. Blood glucose at the time of injection was 94 mg/dL.  Marland Kitchen LIMITATIONS:  The low-dose CT acquisition was performed only for attenuation correction/activity localization. There is no intravenous contrast, limiting the CT component of the study.  FINDINGS: HEAD AND NECK .  Brain: Visualized portions unremarkable.  .  Face/orbits/sinuses: No abnormal focal hypermetabolic activity. .  Oral cavity/pharynx/salivary/nodes: Hypermetabolic 4.4 x 3.0 cm palatal lesion.  Hypermetabolic left retropharyngeal lymph node measuring 0.8 cm (image 18, series 3). Large, hypermetabolic 7.4 x 5.1 cm necrotic left cervical chain lymph node.  Hypermetabolic 2.4 cm left IIa lymph node. Hypermetabolic 2.6  cm right IIb lymph node. .  Cervical soft tissues/thyroid: No abnormal focal FDG activity or large masses. Atherosclerotic calcification of the bilateral carotid   CT Neck/Thyroid WO Contrast7/28/2015  [facility]  CT Neck/Thyroid WO Contrast7/28/2015  [facility]  Result Impression    1. Large oropharyngeal mass as detailed above. 2. Bilateral cervical adenopathy, consistent with local metastatic disease. 3. Possible denervation atrophy involving the left trapezius. 4. Additional findings as above.     Result Narrative  CT NECK WITHOUT CONTRAST, Nov 07, 2013 11:50:23 AM  INDICATION:  146.9 Oropharynx cancer [facility])   COMPARISON: PET/CT dated November 02, 2013.  TECHNIQUE: Axial CT images from the skull base to upper chest were obtained without contrast for the primary purpose of visualizing the soft tissues of the neck. Portions of the brain, face, and cervical spine were also included in the imaging field. Supplemental 2D reformatted images were generated and reviewed as needed.  ADDITIONAL TECHNIQUE:  None  FINDINGS: Note is made that small masses, abscesses, and necrotic lymph nodes may be unapparent without the use of intravenous contrast.  .  Suprahyoid neck: Ill-defined soft tissue mass  fills the majority of the oropharynx. The mass involves the tonsillar regions bilaterally, left greater than right. Direct involvement of the soft palate is also noted with possible involvement of the left torus tubarius. The mass extends to the left glossotonsillar sulcus with slight preferential asymmetric enhancement of the left lingual tonsils. However, this region did not demonstrate hypermetabolic activity on the prior PET/CT examination, making neoplastic involvement less likely. Approximate dimensions of the mass measure 5.2 x 3.7 cm. Normal appearing parotid and submandibular glands. .  Larynx:  No masses. .  Infrahyoid neck: No masses. Subcentimeter  hypodensity involves the right thyroid lobe. .  Lymph nodes:  Multiple cervical lymph nodes are identified bilaterally, some of which demonstrate central necrosis. The largest lymph node conglomerate is seen within the left level IIb region, inseparable from the adjacent sternocleidomastoid muscle, and measuring approximately 7.6 x 5.0 cm. Additionally, atrophy of the left trapezius is demonstrated, suggestive of accessory nerve involvement and resulting denervation atrophy. Largest lymph node conglomerate on the right measures approximately 2.8 x 2.0 cm, also within the level IIb region. Marland Kitchen  Upper chest/mediastinum: Healed right clavicular fracture is identified. Other: Areas of hypoattenuation involving the right frontal, parietal, and temporal regions may reflect subacute to chronic areas of ischemia. No prior CT is available for comparison and this region was excluded from view on the the comparison PET CT. Intracranial atherosclerosis is present. Mucosal thickening of the left maxillary sinus is demonstrated. Cervical spondylosis is noted.     PATHOLOGY:  for CORDELL, GUERCIO ([id number]) Patient: NELSON, NOONE Collected: 09/21/2013 Client: [facility] Accession: [id number] Received: 09/22/2013 Thomes Lolling, MD DOB: [birth year] Age: 64 Gender: M Reported: 09/25/2013 [address], [address] Patient Ph: MRN#: [place], [place] [id number] Client Acc#: Chart: Phone: [phone] Fax: LMP: Visit#: CC: CYTOPATHOLOGY REPORT Adequacy Reason Satisfactory For Evaluation. Diagnosis NECK, FINE NEEDLE ASPIRATION LEFT (SPECIMEN 2 OF 2 COLLECTED 09/21/13) MALIGNANT CELLS PRESENT, CONSISTENT WITH SQUAMOUS CELL CARCINOMA. Enid Cutter MD Pathologist, Electronic Signature (Case signed 09/25/2013) Specimen Clinical Information Left cervical adenopathy with midline palate mass, Flow Cytometry (If looks like Lymphoma) Please and P16 Source Neck, Fine Needle Aspiration, Left,  (Specimen 2 of 2, collected on 09/21/13 ) Gross Specimen: Received is/are 50cc's of pink-red fluid with tissue. (PH:ph) Prepared: # Smears: 0 # Concentration Technique Slides (i.e. ThinPrep): 1 # Cell Block: 1 Conventional Additional Studies: N/A Report signed out from the following location(s) Technical Component and Interpretation performed at [facility], [place], [place] [address]. CLIA #: [id number],    for NIELS, CRANSHAW ([id number]) Patient: Roxanne Mins D Collected: 09/21/2013 Client: [facility] Accession: [id number] Received: 09/22/2013 Thomes Lolling, MD DOB: 23-Oct-1950 Age: 21 Gender: M Reported: 09/25/2013 [address], [address] Patient Ph: MRN#: [place], [place] [address] Client Acc#: Chart: Phone: [phone] Fax: LMP: Visit#: CC: CYTOPATHOLOGY REPORT Adequacy Reason Satisfactory For Evaluation. Diagnosis NECK, FINE NEEDLE ASPIRATION RIGHT (SPECIMEN 1 OF 2 COLLECTED ON 09/21/13) MALIGNANT CELLS PRESENT, CONSISTENT WITH SQUAMOUS CELL CARCINOMA. Enid Cutter MD Pathologist, Electronic Signature (Case signed 09/25/2013) Specimen Clinical Information Right cervical adenopathy with midline palate mass, P16 and Flow Cytometry please (If looks like Lymphoma) Source Neck, Fine Needle Aspiration, Right , (Specimen 1 of 2, collected on 09/21/13 ) Gross Specimen: Received is/are 30cc's of pink-red fluid with flecks. (PH:ph) Prepared: # Smears: 0 # Concentration Technique Slides (i.e. ThinPrep): 1 # Cell Block: 1 Conventional Additional Studies: N/A  Report signed out from the following location(s) Associate Professor and Interpretation performed at [facility], [place], [place] [address].    for JHOVANY, WEIDINGER ([id number]) Patient: KEIN, CARLBERG Collected: 09/21/2013 Client: [facility] Accession: [id number] Received: 09/21/2013 Thomes Lolling, MD DOB:  Apr 19, 1950 Age: 3 Gender: M Reported: 09/25/2013 [address] Patient Ph: MRN #: [place], [place] [id number] Client Acc#: Chart #: Phone: [phone] Fax: CC: REPORT OF SURGICAL PATHOLOGY ADDITIONAL INFORMATION: The tumor cells are negative for p16. (JBK:ecj 09/25/2013) Enid Cutter MD Pathologist, Electronic Signature ( Signed 09/25/2013) FINAL DIAGNOSIS Diagnosis Palate, biopsy, midline mass - SQUAMOUS CELL CARCINOMA. - SEE COMMENT. Microscopic Comment The carcinoma is moderately to poorly differentiated. A p16 immunohistochemical stain will be performed and the results reportedly separately. (JBK:ecj 09/25/2013) Enid Cutter MD Pathologist, Electronic Signature (Case signed 09/25/2013) Specimen Gross and Clinical Information Specimen Comment Former heavy smoker Specimen(s) Obtained: Palate, biopsy, midline mass Specimen Clinical Information Palate cancer with neck metastasis 1 of 2 FINAL for CANDACE, RAMUS ([id number]) Gross Received in formalin is a friable tan white irregular soft tissue nodule measuring 0.9 x 0.9 x 0.4 cm. The outer surface is covered by possible mucosa, is tan white and friable and inked black. The specimen is bisected to reveal a solid tan white cut surface. The specimen is entirely submitted in one cassette. (JNK:kh 09-23-14) Stain(s) used in Diagnosis: The following stain(s) were used in diagnosing the case: P16. The control(s) stained appropriately. Disclaimer Some of these immunohistochemical stains may have been developed and the performance characteristics determined by [facility]. Some may not have been cleared or approved by the U.S. Food and Drug Administration. The FDA has determined that such clearance or approval is not necessary. This test is used for clinical purposes. It should not be regarded as investigational or for research. This laboratory is certified under the [place] (CLIA-88) as qualified to perform high complexity clinical laboratory testing. Report signed out from the following location(s) Technical Component performed at [facility]. [address],[place] [address].NOMV:[id number],GEZ:[id number]., Technical Component performed at [facility] [place], [place], [place] [address]. CLIA #: [id number], Interpretation performed at [facility], [place], [place] [address]. CLIA #: [id number],     IMPRESSION:  #1. 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 #2. Stage IV chronic kidney disease #3. Chronic obstructive pulmonary disease #4. Coronary artery disease without evidence of dysrhythmia or heart failure at this time. #5 Peripheral vascular disease. #6. Protein calorie malnutrition. #7 Chronic obstructive pulmonary disease. #8. Hypertension. #9. Status post CVA, no significant neurologic deficit. #10. Anemia of chronic disease.    PLAN:  #1. A discussion was held with the patient and his significant other regarding management. Due to his severe kidney disease, platinum containing chemotherapy combinations are unacceptable without dialysis. Alternative intervention would include Erbitux in combination with external beam radiotherapy. #2. Priority-wise will first control pain with oxycodone liquid along with Flonase inhaler for his nose. #3. Life port insertion at [facility]. #4. Begin treatment with Erbitux weekly hopefully on 11/27/2013 and refer to [facility] to begin external beam radiotherapy shortly thereafter providing for nutrition with a PEG tube and airway protection with tracheostomy. #5. Consult with Dr. Simeon Craft regarding PEG tube placement and tracheostomy in order  to maintain nutrition and prevent upper airway obstruction. #6.Transfuse  2 units packed RBC's. #6. Followup on 11/27/2013 to begin  Erbitux therapy prior to initiation of combined modality with external beam RT.  I appreciate the opportunity of sharing in his care. Overall prognosis is guarded.     Doroteo Bradford, MD 11/16/2013 4:35 PM   DISCLAIMER:  This note was dictated with voice recognition softwre.  Similar sounding words can inadvertently be transcribed inaccurately and may not be corrected upon review.

## 2013-11-16 NOTE — Progress Notes (Signed)
Mark Sanford presented for labwork. Labs per MD order drawn via Peripheral Line 23 gauge needle inserted in right antecubital.  Sluggish blood return present. Procedure without incident.  Needle removed intact. Patient tolerated procedure well.

## 2013-11-16 NOTE — Patient Instructions (Signed)
[place] Discharge Instructions  RECOMMENDATIONS MADE BY THE CONSULTANT AND ANY TEST RESULTS WILL BE SENT TO YOUR REFERRING PHYSICIAN.  EXAM FINDINGS BY THE PHYSICIAN TODAY AND SIGNS OR SYMPTOMS TO REPORT TO CLINIC OR PRIMARY PHYSICIAN: Exam and findings as discussed by Dr.Formanek.  MEDICATIONS PRESCRIBED:  Continue as prescribed. You were given a Lennell Shanks prescription for Flonase for your nose and Oxycodone for pain. Use as directed.  INSTRUCTIONS/FOLLOW-UP: Lab work today. We will get you an appointment at [facility] for a port a cath. We will plan for chemotherapy after port placement using Erbitux only. We will get an appointment with a specialist about having a tube placed in your stomach to feed you through. We will get an appointment for radiation at [facility] in [place]. Return to clinic here 11/27/13 to see MD. Report any issues/concerns to clinic as needed.  Thank you for choosing [facility] to provide your oncology and hematology care.  To afford each patient quality time with our providers, please arrive at least 15 minutes before your scheduled appointment time.  With your help, our goal is to use those 15 minutes to complete the necessary work-up to ensure our physicians have the information they need to help with your evaluation and healthcare recommendations.    Effective January 1st, 2014, we ask that you re-schedule your appointment with our physicians should you arrive 10 or more minutes late for your appointment.  We strive to give you quality time with our providers, and arriving late affects you and other patients whose appointments are after yours.    Again, thank you for choosing [facility].  Our hope is that these requests will decrease the amount of time that you wait before being seen by our physicians.        _____________________________________________________________  Should you have questions after your visit to [facility], please contact our office at [phone] between the hours of 8:30 a.m. and 4:30 p.m.  Voicemails left after 4:30 p.m. will not be returned until the following business day.  For prescription refill requests, have your pharmacy contact our office with your prescription refill request.    _______________________________________________________________  We hope that we have given you very good care.  You may receive a patient satisfaction survey in the mail, please complete it and return it as soon as possible.  We value your feedback!  _______________________________________________________________  Have you asked about our STAR program?  STAR stands for Survivorship Training and Rehabilitation, and this is a nationally recognized cancer care program that focuses on survivorship and rehabilitation.  Cancer and cancer treatments may cause problems, such as, pain, making you feel tired and keeping you from doing the things that you need or want to do. Cancer rehabilitation can help. Our goal is to reduce these troubling effects and help you have the best quality of life possible.  You may receive a survey from a nurse that asks questions about your current state of health.  Based on the survey results, all eligible patients will be referred to the [facility] program for an evaluation so we can better serve you!  A frequently asked questions sheet is available upon request.

## 2013-11-17 ENCOUNTER — Telehealth (HOSPITAL_COMMUNITY): Payer: Self-pay | Admitting: *Deleted

## 2013-11-17 ENCOUNTER — Other Ambulatory Visit (HOSPITAL_COMMUNITY): Payer: Self-pay | Admitting: Hematology and Oncology

## 2013-11-17 LAB — FERRITIN: Ferritin: 405 ng/mL — ABNORMAL HIGH (ref 22–322)

## 2013-11-17 NOTE — Telephone Encounter (Signed)
Spoke with patient. He will arrange for transportation to clinic on Monday for blood transfusion.

## 2013-11-17 NOTE — Telephone Encounter (Signed)
I could not reach Mark Sanford by phone due to the [phone] ringing busy. [facility] called back and let me know that they finally got pt's Medicaid to run through and that patient could in fact get his pain medication. I called Mark Sanford's emergency contact, Mark Sanford, and asked her to please get in touch with him & let him know that he could get his pain medication filled today. She said ok. Mark Sanford also gave me an alternate phone # but it did not go through due to the patient not having any minutes on his phone.

## 2013-11-17 NOTE — Telephone Encounter (Signed)
Message copied by Berneta Levins on Fri Nov 17, 2013 10:58 AM ------      Message from: Farrel Gobble A      Created: Thu Nov 16, 2013  7:55 PM       Hgb low on 11/16/2013. See if he can come in on 11/17/2013 for 2 units packed RBC's.  Thanks.  ------

## 2013-11-17 NOTE — Addendum Note (Signed)
Addended by: Gerhard Perches on: 11/17/2013 03:53 PM   Modules accepted: Orders, SmartSet

## 2013-11-20 ENCOUNTER — Encounter (HOSPITAL_COMMUNITY): Payer: Medicaid Other

## 2013-11-20 ENCOUNTER — Other Ambulatory Visit: Payer: Self-pay | Admitting: Radiology

## 2013-11-20 ENCOUNTER — Encounter (HOSPITAL_BASED_OUTPATIENT_CLINIC_OR_DEPARTMENT_OTHER): Payer: Medicaid Other

## 2013-11-20 VITALS — BP 187/71 | HR 74 | Temp 97.9°F | Resp 18

## 2013-11-20 DIAGNOSIS — N184 Chronic kidney disease, stage 4 (severe): Secondary | ICD-10-CM

## 2013-11-20 DIAGNOSIS — C051 Malignant neoplasm of soft palate: Secondary | ICD-10-CM

## 2013-11-20 DIAGNOSIS — N039 Chronic nephritic syndrome with unspecified morphologic changes: Secondary | ICD-10-CM

## 2013-11-20 DIAGNOSIS — C8581 Other specified types of non-Hodgkin lymphoma, lymph nodes of head, face, and neck: Secondary | ICD-10-CM

## 2013-11-20 DIAGNOSIS — D631 Anemia in chronic kidney disease: Secondary | ICD-10-CM

## 2013-11-20 LAB — PREPARE RBC (CROSSMATCH)

## 2013-11-20 MED ORDER — LIDOCAINE-PRILOCAINE 2.5-2.5 % EX CREA: TOPICAL_CREAM | CUTANEOUS | Status: DC

## 2013-11-20 MED ORDER — PROCHLORPERAZINE MALEATE 10 MG PO TABS: 10.0000 mg | ORAL_TABLET | Freq: Four times a day (QID) | ORAL | Status: DC | PRN

## 2013-11-20 MED ORDER — DIPHENHYDRAMINE HCL 25 MG PO CAPS
25.0000 mg | ORAL_CAPSULE | Freq: Once | ORAL | Status: AC
  Administered 2013-11-20: 25 mg via ORAL

## 2013-11-20 MED ORDER — DIPHENHYDRAMINE HCL 25 MG PO CAPS
ORAL_CAPSULE | ORAL | Status: AC
  Filled 2013-11-20: qty 1

## 2013-11-20 MED ORDER — ACETAMINOPHEN 325 MG PO TABS
650.0000 mg | ORAL_TABLET | Freq: Once | ORAL | Status: AC
  Administered 2013-11-20: 650 mg via ORAL

## 2013-11-20 MED ORDER — ACETAMINOPHEN 325 MG PO TABS
ORAL_TABLET | ORAL | Status: AC
  Filled 2013-11-20: qty 2

## 2013-11-20 MED ORDER — SODIUM CHLORIDE 0.9 % IV SOLN: 250.0000 mL | Freq: Once | INTRAVENOUS | Status: AC

## 2013-11-20 MED ORDER — SODIUM CHLORIDE 0.9 % IV SOLN
250.0000 mL | Freq: Once | INTRAVENOUS | Status: AC
  Administered 2013-11-20: 09:00:00 via INTRAVENOUS

## 2013-11-20 NOTE — Progress Notes (Signed)
Chemo teaching done and consent signed for Erbitux. Med/chemo calendar given.

## 2013-11-20 NOTE — Patient Instructions (Addendum)
Mark Sanford   CHEMOTHERAPY INSTRUCTIONS  Erbitux - this is a monoclonal antibody. This medication is derived partially from a mouse protein.   Side Effects: infusion related reactions may include bronchospasm, fever, chills, shaking chills, itching, swelling around heart/blood vessels, low blood pressure, lung toxicity, acneform rash. It can also cause dry skin, fatigue, muscle aches, diarrhea, vomiting, no appetite, low white blood cell count, low magnesium, weight loss.  The first time you receive ERBITUX it will take 2 hours to infuse. After that, it will only take 1 hour to infuse. Prior to receiving ERBITUX you will receive Benadryl through your IV. This will probably make you sleepy/groggy. We give the Benadryl to lessen the risk of you having an allergic reaction to the Erbitux.   You will receive this medication once a week for several weeks in a row.    POTENTIAL SIDE EFFECTS OF TREATMENT: Increased Susceptibility to Infection, Vomiting, Hair Thinning, Changes in Character of Skin and Nails (brittleness, dryness,etc.), Bone Marrow Suppression, Abdominal Cramping, Nausea, Diarrhea, Sun Sensitivity and Mouth Sores   EDUCATIONAL MATERIALS GIVEN AND REVIEWED: Chemotherapy and You  Specific Instructions Sheets: Erbitux, Coimpazine, EMLA cream, Benadryl   SELF CARE ACTIVITIES WHILE ON CHEMOTHERAPY:  Increase your fluid intake 48 hours prior to treatment and drink at least 2 quarts per day after treatment., No alcohol intake., No aspirin or other medications unless approved by your oncologist., Eat foods that are light and easy to digest., Eat foods at cold or room temperature., No fried, fatty, or spicy foods immediately before or after treatment., Have teeth cleaned professionally before starting treatment. Keep dentures and partial plates clean., Use soft toothbrush and do not use mouthwashes that contain alcohol. Biotene is a good mouthwash. Use warm  salt water gargles (1 teaspoon salt per 1 quart warm water) before and after meals and at bedtime. Or you may rinse with 2 tablespoons of three -percent hydrogen peroxide mixed in eight ounces of water. Always use sunscreen with SPF (Sun Protection Factor) of 30 or higher. Use your nausea medication as directed to prevent nausea. Use your stool softener or laxative as directed to prevent constipation. Use your anti-diarrheal medication as directed to stop diarrhea.  Please wash your hands for at least 30 seconds using warm soapy water. Handwashing is the #1 way to prevent the spread of germs. Stay away from sick people or people who are getting over a cold. If you develop respiratory systems such as green/yellow mucus production or productive cough or persistent cough let us know and we will see if you need an antibiotic. It is a good idea to keep a pair of gloves on when going into grocery stores/[employer] to decrease your risk of coming into contact with germs on the carts, etc. Carry alcohol hand gel with you at all times and use it frequently if out in public. All foods need to be cooked thoroughly. No raw foods. No medium or undercooked meats, eggs. If your food is cooked medium well, it does not need to be hot pink or saturated with bloody liquid at all. Vegetables and fruits need to be washed/rinsed under the faucet with a dish detergent before being consumed. You can eat raw fruits and vegetables unless we tell you otherwise but it would be best if you cooked them or bought frozen. Do not eat off of salad bars or hot bars unless you really trust the cleanliness of the restaurant. If you need dental work, please  let us know before you go for your appointment so that we can coordinate the best possible time for you in regards to your chemo regimen. You need to also let your dentist know that you are actively taking chemo. We may need to do labs prior to your dental appointment. We also want your bowels moving  at least every other day. If this is not happening, we need to know so that we can get you on a bowel regimen to help you go.     MEDICATIONS: You have been given prescriptions for the following medications:  Compazine/Prochlorperazine 10mg  tablet. May take 1 tablet every 6 hours as needed for nausea/vomiting.   EMLA cream. Apply a quarter size amount to port site 1 hour prior to chemo. Do not rub in. Cover with plastic wrap.    Over-the-Counter Meds:  Colace - this is a stool softener. Take 100mg  capsule 2-6 times a day as needed. If you have to take more than 6 capsules of Colace a day call the [facility].  Senna - this is a mild laxative used to treat mild constipation. May take 2 tabs by mouth daily or up to twice a day as needed for mild constipation.  Milk of Magnesia - this is a laxative used to treat moderate to severe constipation. May take 2-4 tablespoons every 8 hours as needed. May increase to 8 tablespoons x 1 dose and if no bowel movement call the [facility].  Imodium - this is for diarrhea. Take 2 tabs after 1st loose stool and then 1 tab after each loose stool until you go a total of 12 hours without a loose stool. Call [facility] if loose stools continue.   SYMPTOMS TO REPORT AS SOON AS POSSIBLE AFTER TREATMENT:  FEVER GREATER THAN 100.5 F  CHILLS WITH OR WITHOUT FEVER  NAUSEA AND VOMITING THAT IS NOT CONTROLLED WITH YOUR NAUSEA MEDICATION  UNUSUAL SHORTNESS OF BREATH  UNUSUAL BRUISING OR BLEEDING  TENDERNESS IN MOUTH AND THROAT WITH OR WITHOUT PRESENCE OF ULCERS  URINARY PROBLEMS  BOWEL PROBLEMS  UNUSUAL RASH    Wear comfortable clothing and clothing appropriate for easy access to any Portacath or PICC line. Let us know if there is anything that we can do to make your therapy better!      I have been informed and understand all of the instructions given to me and have received a copy. I have been instructed to call the clinic [phone]  or my family physician as soon as possible for continued medical care, if indicated. I do not have any more questions at this time but understand that I may call the [facility] or the Patient Navigator at [phone] during office hours should I have questions or need assistance in obtaining follow-up care.           Cetuximab injection What is this medicine? CETUXIMAB (se TUX i mab) is a chemotherapy drug. It targets a specific protein within cancer cells and stops the cells from growing. It is used to treat colorectal cancer and head and neck cancer. This medicine may be used for other purposes; ask your health care provider or pharmacist if you have questions. COMMON BRAND NAME(S): Erbitux What should I tell my health care provider before I take this medicine? They need to know if you have any of these conditions: -heart disease -history of irregular heartbeat -history of low levels of calcium, magnesium, or potassium in the blood -lung or breathing  disease, like asthma -an unusual or allergic reaction to cetuximab, other medicines, foods, dyes, or preservatives -pregnant or trying to get pregnant -breast-feeding How should I use this medicine? This drug is given as an infusion into a vein. It is administered in a hospital or clinic by a specially trained health care professional. Talk to your pediatrician regarding the use of this medicine in children. Special care may be needed. Overdosage: If you think you have taken too much of this medicine contact a poison control center or emergency room at once. NOTE: This medicine is only for you. Do not share this medicine with others. What if I miss a dose? It is important not to miss your dose. Call your doctor or health care professional if you are unable to keep an appointment. What may interact with this medicine? Interactions are not expected. This list may not describe all possible interactions. Give your health care  provider a list of all the medicines, herbs, non-prescription drugs, or dietary supplements you use. Also tell them if you smoke, drink alcohol, or use illegal drugs. Some items may interact with your medicine. What should I watch for while using this medicine? Visit your doctor or health care professional for regular checks on your progress. This drug may make you feel generally unwell. This is not uncommon, as chemotherapy can affect healthy cells as well as cancer cells. Report any side effects. Continue your course of treatment even though you feel ill unless your doctor tells you to stop. This medicine can make you more sensitive to the sun. Keep out of the sun while taking this medicine and for 2 months after the last dose. If you cannot avoid being in the sun, wear protective clothing and use sunscreen. Do not use sun lamps or tanning beds/booths. You may need blood work done while you are taking this medicine. In some cases, you may be given additional medicines to help with side effects. Follow all directions for their use. Call your doctor or health care professional for advice if you get a fever, chills or sore throat, or other symptoms of a cold or flu. Do not treat yourself. This drug decreases your body's ability to fight infections. Try to avoid being around people who are sick. Avoid taking products that contain aspirin, acetaminophen, ibuprofen, naproxen, or ketoprofen unless instructed by your doctor. These medicines may hide a fever. Do not become pregnant while taking this medicine. Women should inform their doctor if they wish to become pregnant or think they might be pregnant. There is a potential for serious side effects to an unborn child. Use adequate birth control methods. Avoid pregnancy for at least 6 months after your last dose. Talk to your health care professional or pharmacist for more information. Do not breast-feed an infant while taking this medicine or during the 2 months  after your last dose. What side effects may I notice from receiving this medicine? Side effects that you should report to your doctor or health care professional as soon as possible: -allergic reactions like skin rash, itching or hives, swelling of the face, lips, or tongue -breathing problems -changes in vision -fast, irregular heartbeat -feeling faint or lightheaded, falls -fever, chills -mouth sores -redness, blistering, peeling or loosening of the skin, including inside the mouth -trouble passing urine or change in the amount of urine -unusually weak or tired Side effects that usually do not require medical attention (report to your doctor or health care professional if they continue or are  bothersome): -changes in skin like acne, cracks, skin dryness -constipation -diarrhea -headache -nail changes -nausea, vomiting -stomach upset -weight loss This list may not describe all possible side effects. Call your doctor for medical advice about side effects. You may report side effects to FDA at 1-800-FDA-1088. Where should I keep my medicine? This drug is given in a hospital or clinic and will not be stored at home. NOTE: This sheet is a summary. It may not cover all possible information. If you have questions about this medicine, talk to your doctor, pharmacist, or health care provider.  2015, [employer] Standard. (2013-07-12 16:14:34) Prochlorperazine tablets What is this medicine? PROCHLORPERAZINE (proe klor PER a zeen) helps to control severe nausea and vomiting. This medicine is also used to treat schizophrenia. It can also help patients who experience anxiety that is not due to psychological illness. This medicine may be used for other purposes; ask your health care provider or pharmacist if you have questions. COMMON BRAND NAME(S): Compazine What should I tell my health care provider before I take this medicine? They need to know if you have any of these conditions: -blood  disorders or disease -dementia -liver disease or jaundice -Parkinson's disease -uncontrollable movement disorder -an unusual or allergic reaction to prochlorperazine, other medicines, foods, dyes, or preservatives -pregnant or trying to get pregnant -breast-feeding How should I use this medicine? Take this medicine by mouth with a glass of water. Follow the directions on the prescription label. Take your doses at regular intervals. Do not take your medicine more often than directed. Do not stop taking this medicine suddenly. This can cause nausea, vomiting, and dizziness. Ask your doctor or health care professional for advice. Talk to your pediatrician regarding the use of this medicine in children. Special care may be needed. While this drug may be prescribed for children as young as 2 years for selected conditions, precautions do apply. Overdosage: If you think you have taken too much of this medicine contact a poison control center or emergency room at once. NOTE: This medicine is only for you. Do not share this medicine with others. What if I miss a dose? If you miss a dose, take it as soon as you can. If it is almost time for your next dose, take only that dose. Do not take double or extra doses. What may interact with this medicine? Do not take this medicine with any of the following medications: -amoxapine -antidepressants like citalopram, escitalopram, fluoxetine, paroxetine, and sertraline -deferoxamine -dofetilide -maprotiline -tricyclic antidepressants like amitriptyline, clomipramine, imipramine, nortiptyline and others This medicine may also interact with the following medications: -lithium -medicines for pain -phenytoin -propranolol -warfarin This list may not describe all possible interactions. Give your health care provider a list of all the medicines, herbs, non-prescription drugs, or dietary supplements you use. Also tell them if you smoke, drink alcohol, or use illegal  drugs. Some items may interact with your medicine. What should I watch for while using this medicine? Visit your doctor or health care professional for regular checks on your progress. You may get drowsy or dizzy. Do not drive, use machinery, or do anything that needs mental alertness until you know how this medicine affects you. Do not stand or sit up quickly, especially if you are an older patient. This reduces the risk of dizzy or fainting spells. Alcohol may interfere with the effect of this medicine. Avoid alcoholic drinks. This medicine can reduce the response of your body to heat or cold. Dress warm in cold  weather and stay hydrated in hot weather. If possible, avoid extreme temperatures like saunas, hot tubs, very hot or cold showers, or activities that can cause dehydration such as vigorous exercise. This medicine can make you more sensitive to the sun. Keep out of the sun. If you cannot avoid being in the sun, wear protective clothing and use sunscreen. Do not use sun lamps or tanning beds/booths. Your mouth may get dry. Chewing sugarless gum or sucking hard candy, and drinking plenty of water may help. Contact your doctor if the problem does not go away or is severe. What side effects may I notice from receiving this medicine? Side effects that you should report to your doctor or health care professional as soon as possible: -blurred vision -breast enlargement in men or women -breast milk in women who are not breast-feeding -chest pain, fast or irregular heartbeat -confusion, restlessness -dark yellow or brown urine -difficulty breathing or swallowing -dizziness or fainting spells -drooling, shaking, movement difficulty (shuffling walk) or rigidity -fever, chills, sore throat -involuntary or uncontrollable movements of the eyes, mouth, head, arms, and legs -seizures -stomach area pain -unusually weak or tired -unusual bleeding or bruising -yellowing of skin or eyes Side effects  that usually do not require medical attention (report to your doctor or health care professional if they continue or are bothersome): -difficulty passing urine -difficulty sleeping -headache -sexual dysfunction -skin rash, or itching This list may not describe all possible side effects. Call your doctor for medical advice about side effects. You may report side effects to FDA at 1-800-FDA-1088. Where should I keep my medicine? Keep out of the reach of children. Store at room temperature between 15 and 30 degrees C (59 and 86 degrees F). Protect from light. Throw away any unused medicine after the expiration date. NOTE: This sheet is a summary. It may not cover all possible information. If you have questions about this medicine, talk to your doctor, pharmacist, or health care provider.  2015, [employer] Standard. (2011-08-18 16:59:39) Lidocaine; Prilocaine cream What is this medicine? LIDOCAINE; PRILOCAINE (LYE doe kane; PRIL oh kane) is a topical anesthetic that causes loss of feeling in the skin and surrounding tissues. It is used to numb the skin before procedures or injections. This medicine may be used for other purposes; ask your health care provider or pharmacist if you have questions. COMMON BRAND NAME(S): EMLA What should I tell my health care provider before I take this medicine? They need to know if you have any of these conditions: -glucose-6-phosphate deficiencies -heart disease -kidney or liver disease -methemoglobinemia -an unusual or allergic reaction to lidocaine, prilocaine, other medicines, foods, dyes, or preservatives -pregnant or trying to get pregnant -breast-feeding How should I use this medicine? This medicine is for external use only on the skin. Do not take by mouth. Follow the directions on the prescription label. Wash hands before and after use. Do not use more or leave in contact with the skin longer than directed. Do not apply to eyes or open wounds. It  can cause irritation and blurred or temporary loss of vision. If this medicine comes in contact with your eyes, immediately rinse the eye with water. Do not touch or rub the eye. Contact your health care provider right away. Talk to your pediatrician regarding the use of this medicine in children. While this medicine may be prescribed for children for selected conditions, precautions do apply. Overdosage: If you think you have taken too much of this medicine contact a poison control  center or emergency room at once. NOTE: This medicine is only for you. Do not share this medicine with others. What if I miss a dose? This medicine is usually only applied once prior to each procedure. It must be in contact with the skin for a period of time for it to work. If you applied this medicine later than directed, tell your health care professional before starting the procedure. What may interact with this medicine? -acetaminophen -chloroquine -dapsone -medicines to control heart rhythm -nitrates like nitroglycerin and nitroprusside -other ointments, creams, or sprays that may contain anesthetic medicine -phenobarbital -phenytoin -quinine -sulfonamides like sulfacetamide, sulfamethoxazole, sulfasalazine and others This list may not describe all possible interactions. Give your health care provider a list of all the medicines, herbs, non-prescription drugs, or dietary supplements you use. Also tell them if you smoke, drink alcohol, or use illegal drugs. Some items may interact with your medicine. What should I watch for while using this medicine? Be careful to avoid injury to the treated area while it is numb and you are not aware of pain. Avoid scratching, rubbing, or exposing the treated area to hot or cold temperatures until complete sensation has returned. The numb feeling will wear off a few hours after applying the cream. What side effects may I notice from receiving this medicine? Side effects that you  should report to your doctor or health care professional as soon as possible: -blurred vision -chest pain -difficulty breathing -dizziness -drowsiness -fast or irregular heartbeat -skin rash or itching -swelling of your throat, lips, or face -trembling Side effects that usually do not require medical attention (report to your doctor or health care professional if they continue or are bothersome): -changes in ability to feel hot or cold -redness and swelling at the application site This list may not describe all possible side effects. Call your doctor for medical advice about side effects. You may report side effects to FDA at 1-800-FDA-1088. Where should I keep my medicine? Keep out of reach of children. Store at room temperature between 15 and 30 degrees C (59 and 86 degrees F). Keep container tightly closed. Throw away any unused medicine after the expiration date. NOTE: This sheet is a summary. It may not cover all possible information. If you have questions about this medicine, talk to your doctor, pharmacist, or health care provider.  2015, [employer] Standard. (2007-10-03 17:14:35) Diphenhydramine injection What is this medicine? DIPHENHYDRAMINE (dye fen HYE dra meen) is an antihistamine. It is used to treat the symptoms of an allergic reaction and motion sickness. It is also used to treat Parkinson's disease. This medicine may be used for other purposes; ask your health care provider or pharmacist if you have questions. COMMON BRAND NAME(S): Benadryl What should I tell my health care provider before I take this medicine? They need to know if you have any of these conditions: -asthma or lung disease -glaucoma -high blood pressure or heart disease -liver disease -pain or difficulty passing urine -prostate trouble -ulcers or other stomach problems -an unusual or allergic reaction to diphenhydramine, antihistamines, other medicines foods, dyes, or preservatives -pregnant or  trying to get pregnant -breast-feeding How should I use this medicine? This medicine is for injection into a vein or a muscle. It is usually given by a health care professional in a hospital or clinic setting. If you get this medicine at home, you will be taught how to prepare and give this medicine. Use exactly as directed. Take your medicine at regular intervals. Do  not take your medicine more often than directed. It is important that you put your used needles and syringes in a special sharps container. Do not put them in a trash can. If you do not have a sharps container, call your pharmacist or healthcare provider to get one. Talk to your pediatrician regarding the use of this medicine in children. While this drug may be prescribed for selected conditions, precautions do apply. This medicine is not approved for use in newborns and premature babies. Patients over 65 years old may have a stronger reaction and need a smaller dose. Overdosage: If you think you have taken too much of this medicine contact a poison control center or emergency room at once. NOTE: This medicine is only for you. Do not share this medicine with others. What if I miss a dose? If you miss a dose, take it as soon as you can. If it is almost time for your next dose, take only that dose. Do not take double or extra doses. What may interact with this medicine? Do not take this medicine with any of the following medications: -MAOIs like Carbex, Eldepryl, Marplan, Nardil, and Parnate This medicine may also interact with the following medications: -alcohol -barbiturates, like phenobarbital -medicines for bladder spasm like oxybutynin, tolterodine -medicines for blood pressure -medicines for depression, anxiety, or psychotic disturbances -medicines for movement abnormalities or Parkinson's disease -medicines for sleep -other medicines for cold, cough or allergy -some medicines for the stomach like chlordiazepoxide,  dicyclomine This list may not describe all possible interactions. Give your health care provider a list of all the medicines, herbs, non-prescription drugs, or dietary supplements you use. Also tell them if you smoke, drink alcohol, or use illegal drugs. Some items may interact with your medicine. What should I watch for while using this medicine? Your condition will be monitored carefully while you are receiving this medicine. Tell your doctor or healthcare professional if your symptoms do not start to get better or if they get worse. You may get drowsy or dizzy. Do not drive, use machinery, or do anything that needs mental alertness until you know how this medicine affects you. Do not stand or sit up quickly, especially if you are an older patient. This reduces the risk of dizzy or fainting spells. Alcohol may interfere with the effect of this medicine. Avoid alcoholic drinks. Your mouth may get dry. Chewing sugarless gum or sucking hard candy, and drinking plenty of water may help. Contact your doctor if the problem does not go away or is severe. What side effects may I notice from receiving this medicine? Side effects that you should report to your doctor or health care professional as soon as possible: -allergic reactions like skin rash, itching or hives, swelling of the face, lips, or tongue -breathing problems -changes in vision -chills -confused, agitated, nervous -irregular or fast heartbeat -low blood pressure -seizures -tremor -trouble passing urine -unusual bleeding or bruising -unusually weak or tired Side effects that usually do not require medical attention (report to your doctor or health care professional if they continue or are bothersome): -constipation, diarrhea -drowsy -headache -loss of appetite -stomach upset, vomiting -sweating -thick mucous This list may not describe all possible side effects. Call your doctor for medical advice about side effects. You may report  side effects to FDA at 1-800-FDA-1088. Where should I keep my medicine? Keep out of the reach of children. If you are using this medicine at home, you will be instructed on how to store  this medicine. Throw away any unused medicine after the expiration date on the label. NOTE: This sheet is a summary. It may not cover all possible information. If you have questions about this medicine, talk to your doctor, pharmacist, or health care provider.  2015, Elsevier/Gold Standard. (2007-07-19 14:28:35)

## 2013-11-20 NOTE — Progress Notes (Signed)
Dr.Formanek notified of patients current vital signs.  No new orders obtained.   Patient tolerated 2 units of RBC's well.

## 2013-11-21 LAB — TYPE AND SCREEN
ABO/RH(D): A NEG
ANTIBODY SCREEN: NEGATIVE
UNIT DIVISION: 0
Unit division: 0

## 2013-11-21 NOTE — Patient Instructions (Signed)
Called patient to remind him of appointment for portacath placement on 11-22-13.  Arrive at [facility], npo after midnight, have driver.

## 2013-11-22 ENCOUNTER — Encounter (HOSPITAL_COMMUNITY): Payer: Self-pay

## 2013-11-22 ENCOUNTER — Telehealth (HOSPITAL_COMMUNITY): Payer: Self-pay | Admitting: *Deleted

## 2013-11-22 ENCOUNTER — Other Ambulatory Visit (HOSPITAL_COMMUNITY): Payer: Self-pay | Admitting: Hematology and Oncology

## 2013-11-22 ENCOUNTER — Ambulatory Visit (HOSPITAL_COMMUNITY)
Admission: RE | Admit: 2013-11-22 | Discharge: 2013-11-22 | Disposition: A | Discharge status: Home / Self Care | Payer: Medicaid Other | Source: Ambulatory Visit | Attending: Hematology and Oncology | Admitting: Hematology and Oncology

## 2013-11-22 ENCOUNTER — Telehealth (HOSPITAL_COMMUNITY): Payer: Self-pay | Admitting: Radiology

## 2013-11-22 DIAGNOSIS — I10 Essential (primary) hypertension: Secondary | ICD-10-CM | POA: Diagnosis not present

## 2013-11-22 DIAGNOSIS — Z8673 Personal history of transient ischemic attack (TIA), and cerebral infarction without residual deficits: Secondary | ICD-10-CM | POA: Insufficient documentation

## 2013-11-22 DIAGNOSIS — E119 Type 2 diabetes mellitus without complications: Secondary | ICD-10-CM | POA: Diagnosis not present

## 2013-11-22 DIAGNOSIS — C051 Malignant neoplasm of soft palate: Secondary | ICD-10-CM | POA: Diagnosis not present

## 2013-11-22 DIAGNOSIS — D649 Anemia, unspecified: Secondary | ICD-10-CM | POA: Insufficient documentation

## 2013-11-22 DIAGNOSIS — Z452 Encounter for adjustment and management of vascular access device: Secondary | ICD-10-CM | POA: Diagnosis not present

## 2013-11-22 DIAGNOSIS — Z79899 Other long term (current) drug therapy: Secondary | ICD-10-CM | POA: Diagnosis not present

## 2013-11-22 LAB — GLUCOSE, CAPILLARY
GLUCOSE-CAPILLARY: 72 mg/dL (ref 70–99)
GLUCOSE-CAPILLARY: 121 mg/dL — AB (ref 70–99)
Glucose-Capillary: 67 mg/dL — ABNORMAL LOW (ref 70–99)
Glucose-Capillary: 69 mg/dL — ABNORMAL LOW (ref 70–99)

## 2013-11-22 LAB — CBC
HCT: 31 % — ABNORMAL LOW (ref 39.0–52.0)
Hemoglobin: 10.5 g/dL — ABNORMAL LOW (ref 13.0–17.0)
MCH: 30.3 pg (ref 26.0–34.0)
MCHC: 33.9 g/dL (ref 30.0–36.0)
MCV: 89.3 fL (ref 78.0–100.0)
PLATELETS: 128 10*3/uL — AB (ref 150–400)
RBC: 3.47 MIL/uL — ABNORMAL LOW (ref 4.22–5.81)
RDW: 17.2 % — AB (ref 11.5–15.5)
WBC: 11.4 10*3/uL — AB (ref 4.0–10.5)

## 2013-11-22 LAB — BASIC METABOLIC PANEL
Anion gap: 13 (ref 5–15)
BUN: 29 mg/dL — ABNORMAL HIGH (ref 6–23)
CALCIUM: 8.5 mg/dL (ref 8.4–10.5)
CO2: 21 mEq/L (ref 19–32)
Chloride: 103 mEq/L (ref 96–112)
Creatinine, Ser: 4.32 mg/dL — ABNORMAL HIGH (ref 0.50–1.35)
GFR calc Af Amer: 16 mL/min — ABNORMAL LOW (ref >90)
GFR, EST NON AFRICAN AMERICAN: 14 mL/min — AB (ref >90)
GLUCOSE: 75 mg/dL (ref 70–99)
Potassium: 4.5 mEq/L (ref 3.7–5.3)
Sodium: 137 mEq/L (ref 137–147)

## 2013-11-22 LAB — APTT: aPTT: 29 seconds (ref 24–37)

## 2013-11-22 LAB — PROTIME-INR
INR: 1.04 (ref 0.00–1.49)
Prothrombin Time: 13.6 seconds (ref 11.6–15.2)

## 2013-11-22 MED ORDER — DEXTROSE 50 % IV SOLN
50.0000 mL | Freq: Once | INTRAVENOUS | Status: AC | PRN
  Administered 2013-11-22: 25 mL via INTRAVENOUS
  Filled 2013-11-22: qty 50

## 2013-11-22 MED ORDER — CEFAZOLIN SODIUM-DEXTROSE 2-3 GM-% IV SOLR
INTRAVENOUS | Status: AC
  Filled 2013-11-22: qty 50

## 2013-11-22 MED ORDER — MIDAZOLAM HCL 2 MG/2ML IJ SOLN
INTRAMUSCULAR | Status: AC
  Filled 2013-11-22: qty 4

## 2013-11-22 MED ORDER — LIDOCAINE-EPINEPHRINE (PF) 2 %-1:200000 IJ SOLN
INTRAMUSCULAR | Status: AC
  Filled 2013-11-22: qty 20

## 2013-11-22 MED ORDER — HEPARIN SOD (PORK) LOCK FLUSH 100 UNIT/ML IV SOLN
INTRAVENOUS | Status: AC
  Filled 2013-11-22: qty 5

## 2013-11-22 MED ORDER — HEPARIN SOD (PORK) LOCK FLUSH 100 UNIT/ML IV SOLN
500.0000 [IU] | Freq: Once | INTRAVENOUS | Status: AC
  Administered 2013-11-22: 500 [IU] via INTRAVENOUS

## 2013-11-22 MED ORDER — CEFAZOLIN SODIUM-DEXTROSE 2-3 GM-% IV SOLR
2.0000 g | Freq: Once | INTRAVENOUS | Status: AC
  Administered 2013-11-22: 2 g via INTRAVENOUS

## 2013-11-22 MED ORDER — SODIUM CHLORIDE 0.9 % IV SOLN
Freq: Once | INTRAVENOUS | Status: AC
  Administered 2013-11-22: 13:00:00 via INTRAVENOUS

## 2013-11-22 MED ORDER — LIDOCAINE HCL 1 % IJ SOLN
INTRAMUSCULAR | Status: AC
  Filled 2013-11-22: qty 20

## 2013-11-22 MED ORDER — FENTANYL CITRATE 0.05 MG/ML IJ SOLN
INTRAMUSCULAR | Status: AC
  Filled 2013-11-22: qty 4

## 2013-11-22 NOTE — Discharge Instructions (Signed)

## 2013-11-22 NOTE — Procedures (Signed)
Placement of right jugular Portacath.  Tip at SVC/RA junction.  No immediate complication.

## 2013-11-22 NOTE — H&P (Signed)
Mark Sanford is an 63 y.o. male.   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  Past Medical History  Diagnosis Date  . Diabetes mellitus without complication   . Hypertension   . Chronic anemia   . Stroke   . Seizures   . Cancer   . Renal insufficiency     Past Surgical History  Procedure Laterality Date  . Bascilic vein transposition Left 08/09/2013    Procedure: LEFT 1ST STAGE BRACHIAL VEIN TRANSPOSITION;  Surgeon: Conrad [place], MD;  Location: [facility];  Service: Vascular;  Laterality: Left;    Family History  Problem Relation Age of Onset  . Diabetes Mother   . Hypertension Mother   . Diabetes Father   . Hypertension Father    Social History:  reports that he quit smoking about 9 months ago. He has never used smokeless tobacco. He reports that he does not drink alcohol or use illicit drugs.  Allergies: No Known Allergies  Current outpatient prescriptions:amLODipine (NORVASC) 10 MG tablet, Take 10 mg by mouth every morning. , Disp: , Rfl: ;  fluticasone (FLONASE) 50 MCG/ACT nasal spray, 2 sprays in each nostril at bedtime., Disp: 16 g, Rfl: 6;  lidocaine-prilocaine (EMLA) cream, Apply a quarter size amount to port site 1 hour prior to chemo. Do not rub in. Cover with plastic wrap., Disp: 30 g, Rfl: 3 metoprolol tartrate (LOPRESSOR) 25 MG tablet, Take 25 mg by mouth 2 (two) times daily., Disp: , Rfl: ;  oxyCODONE (ROXICODONE) 5 MG/5ML solution, 1 or 2 teaspoon full surgery 4 hours to control pain, Disp: 480 mL, Rfl: 0;  phenytoin (DILANTIN) 100 MG ER capsule, Take 300 mg by mouth at bedtime. , Disp: , Rfl:  prochlorperazine (COMPAZINE) 10 MG tablet, Take 1 tablet (10 mg total) by mouth every 6 (six) hours as needed for nausea or vomiting., Disp: 60 tablet, Rfl: 2;  simvastatin (ZOCOR) 10 MG tablet, Take 10 mg by mouth at  bedtime., Disp: , Rfl:  Current facility-administered medications:0.9 %  sodium chloride infusion, , Intravenous, Once, Lavonia Drafts, PA-C;  ceFAZolin (ANCEF) IVPB 2 g/50 mL premix, 2 g, Intravenous, Once, Lavonia Drafts, PA-C   Results for orders placed in visit on 11/20/13 (from the past 42 hour(s))  PREPARE RBC (CROSSMATCH)     Status: None   Collection Time    11/20/13  3:48 PM      Result Value Ref Range   Order Confirmation ORDER PROCESSED BY BLOOD BANK     No results found.  Review of Systems  Constitutional: Negative for fever.  HENT: Positive for congestion and sore throat.   Respiratory: Positive for shortness of breath. Negative for hemoptysis.        Occ cough  Cardiovascular: Negative for chest pain.  Gastrointestinal: Negative for abdominal pain and blood in stool.       Occ N/V  Genitourinary: Negative for hematuria.  Musculoskeletal: Positive for neck pain. Negative for back pain.  Neurological: Negative for sensory change, speech change, focal weakness, loss of consciousness and headaches.    Blood pressure 165/93, pulse 105, temperature 99 F (37.2 C), temperature source Oral, resp. rate 18, SpO2 100.00%. Physical Exam  Constitutional: He is oriented to person, place, and time.  Thin BM in NAD  Neck:  Left greater than right neck masses/adenopathy; mass  in soft palate  Cardiovascular: Regular rhythm.   Sl tachy; LUE AVF with good thrill/bruit  Respiratory:  Distant BS bilat; bony exostosis of upper sternum,NT (pt states it has been there all his life)  GI: Soft. Bowel sounds are normal. There is no tenderness.  Musculoskeletal: Normal range of motion. He exhibits no edema.  Lymphadenopathy:    He has cervical adenopathy.  Neurological: He is alert and oriented to person, place, and time.     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 Details/risks of procedure d/w pt with his understanding and consent.   Nitzia Perren,D KEVIN 11/22/2013, 12:48 PM

## 2013-11-22 NOTE — Progress Notes (Signed)
Blood glucose 121, pt. More alert, pt. Dressed per Rn,pt. Taken to car per Therapist, sports.

## 2013-11-22 NOTE — Telephone Encounter (Signed)
I spoke with short stay nurse, Candy Sledge RN. Mr. Varnell has been receiving 2000 units procrit every 2 weeks per nephrology. Patient was transfused here on Monday and in light of upcoming treatment in oncology center, we question if this  procrit can be handled from an oncology standpoint if needed.

## 2013-11-22 NOTE — Progress Notes (Signed)
Pt returned from Radiology. He would answer questions but would pause in mid sentence. Checked CBG and it wa 69. Pt was given p-nut butter/crackers and gingerale. Within 20 minutes cbg rechecked and was 67. Pt still alert but not responding quickly to questions" really didn't want to eat the crackers and gingerale". Spoke with Rowe Robert PA Radiology and 1/2 amp D50 was given as ordered.

## 2013-11-23 ENCOUNTER — Inpatient Hospital Stay (HOSPITAL_COMMUNITY): Payer: Medicaid Other

## 2013-11-24 ENCOUNTER — Inpatient Hospital Stay (HOSPITAL_COMMUNITY): Payer: Medicaid Other

## 2013-11-25 ENCOUNTER — Inpatient Hospital Stay (HOSPITAL_COMMUNITY)
Admission: EM | Admit: 2013-11-25 | Discharge: 2013-11-28 | DRG: 070 | Disposition: A | Discharge status: Other Acute Inpatient Hospital | Payer: Medicaid Other | Attending: Internal Medicine | Admitting: Internal Medicine

## 2013-11-25 ENCOUNTER — Encounter (HOSPITAL_COMMUNITY): Payer: Self-pay | Admitting: Emergency Medicine

## 2013-11-25 ENCOUNTER — Emergency Department (HOSPITAL_COMMUNITY): Payer: Medicaid Other

## 2013-11-25 DIAGNOSIS — Z833 Family history of diabetes mellitus: Secondary | ICD-10-CM

## 2013-11-25 DIAGNOSIS — J9602 Acute respiratory failure with hypercapnia: Secondary | ICD-10-CM | POA: Diagnosis not present

## 2013-11-25 DIAGNOSIS — R4182 Altered mental status, unspecified: Secondary | ICD-10-CM | POA: Diagnosis present

## 2013-11-25 DIAGNOSIS — C50919 Malignant neoplasm of unspecified site of unspecified female breast: Secondary | ICD-10-CM | POA: Diagnosis present

## 2013-11-25 DIAGNOSIS — Z87891 Personal history of nicotine dependence: Secondary | ICD-10-CM

## 2013-11-25 DIAGNOSIS — E872 Acidosis, unspecified: Secondary | ICD-10-CM | POA: Diagnosis present

## 2013-11-25 DIAGNOSIS — N179 Acute kidney failure, unspecified: Secondary | ICD-10-CM | POA: Diagnosis present

## 2013-11-25 DIAGNOSIS — E43 Unspecified severe protein-calorie malnutrition: Secondary | ICD-10-CM | POA: Diagnosis present

## 2013-11-25 DIAGNOSIS — E8729 Other acidosis: Secondary | ICD-10-CM | POA: Diagnosis present

## 2013-11-25 DIAGNOSIS — G40909 Epilepsy, unspecified, not intractable, without status epilepticus: Secondary | ICD-10-CM | POA: Diagnosis present

## 2013-11-25 DIAGNOSIS — R569 Unspecified convulsions: Secondary | ICD-10-CM | POA: Diagnosis present

## 2013-11-25 DIAGNOSIS — E870 Hyperosmolality and hypernatremia: Secondary | ICD-10-CM | POA: Diagnosis present

## 2013-11-25 DIAGNOSIS — Z8673 Personal history of transient ischemic attack (TIA), and cerebral infarction without residual deficits: Secondary | ICD-10-CM

## 2013-11-25 DIAGNOSIS — I498 Other specified cardiac arrhythmias: Secondary | ICD-10-CM | POA: Diagnosis present

## 2013-11-25 DIAGNOSIS — E162 Hypoglycemia, unspecified: Secondary | ICD-10-CM | POA: Diagnosis present

## 2013-11-25 DIAGNOSIS — G9341 Metabolic encephalopathy: Principal | ICD-10-CM | POA: Diagnosis present

## 2013-11-25 DIAGNOSIS — C779 Secondary and unspecified malignant neoplasm of lymph node, unspecified: Secondary | ICD-10-CM

## 2013-11-25 DIAGNOSIS — I10 Essential (primary) hypertension: Secondary | ICD-10-CM | POA: Diagnosis present

## 2013-11-25 DIAGNOSIS — R680 Hypothermia, not associated with low environmental temperature: Secondary | ICD-10-CM | POA: Diagnosis not present

## 2013-11-25 DIAGNOSIS — Z8249 Family history of ischemic heart disease and other diseases of the circulatory system: Secondary | ICD-10-CM

## 2013-11-25 DIAGNOSIS — R410 Disorientation, unspecified: Secondary | ICD-10-CM

## 2013-11-25 DIAGNOSIS — N189 Chronic kidney disease, unspecified: Secondary | ICD-10-CM

## 2013-11-25 DIAGNOSIS — T68XXXA Hypothermia, initial encounter: Secondary | ICD-10-CM | POA: Diagnosis not present

## 2013-11-25 DIAGNOSIS — I129 Hypertensive chronic kidney disease with stage 1 through stage 4 chronic kidney disease, or unspecified chronic kidney disease: Secondary | ICD-10-CM | POA: Diagnosis present

## 2013-11-25 DIAGNOSIS — T68XXXD Hypothermia, subsequent encounter: Secondary | ICD-10-CM

## 2013-11-25 DIAGNOSIS — J9600 Acute respiratory failure, unspecified whether with hypoxia or hypercapnia: Secondary | ICD-10-CM | POA: Diagnosis not present

## 2013-11-25 DIAGNOSIS — G9340 Encephalopathy, unspecified: Secondary | ICD-10-CM | POA: Diagnosis present

## 2013-11-25 DIAGNOSIS — Z681 Body mass index (BMI) 19 or less, adult: Secondary | ICD-10-CM

## 2013-11-25 DIAGNOSIS — N184 Chronic kidney disease, stage 4 (severe): Secondary | ICD-10-CM | POA: Diagnosis present

## 2013-11-25 DIAGNOSIS — C051 Malignant neoplasm of soft palate: Secondary | ICD-10-CM | POA: Diagnosis present

## 2013-11-25 LAB — CBC
HCT: 29 % — ABNORMAL LOW (ref 39.0–52.0)
Hemoglobin: 9.7 g/dL — ABNORMAL LOW (ref 13.0–17.0)
MCH: 30.8 pg (ref 26.0–34.0)
MCHC: 33.4 g/dL (ref 30.0–36.0)
MCV: 92.1 fL (ref 78.0–100.0)
Platelets: 124 10*3/uL — ABNORMAL LOW (ref 150–400)
RBC: 3.15 MIL/uL — ABNORMAL LOW (ref 4.22–5.81)
RDW: 16.9 % — AB (ref 11.5–15.5)
WBC: 9.5 10*3/uL (ref 4.0–10.5)

## 2013-11-25 LAB — TYPE AND SCREEN
ABO/RH(D): A NEG
Antibody Screen: NEGATIVE

## 2013-11-25 LAB — COMPREHENSIVE METABOLIC PANEL
ALBUMIN: 1.6 g/dL — AB (ref 3.5–5.2)
ALT: 5 U/L (ref 0–53)
AST: 11 U/L (ref 0–37)
Alkaline Phosphatase: 97 U/L (ref 39–117)
Anion gap: 15 (ref 5–15)
BILIRUBIN TOTAL: 0.2 mg/dL — AB (ref 0.3–1.2)
BUN: 34 mg/dL — ABNORMAL HIGH (ref 6–23)
CALCIUM: 8.7 mg/dL (ref 8.4–10.5)
CO2: 22 mEq/L (ref 19–32)
CREATININE: 4.78 mg/dL — AB (ref 0.50–1.35)
Chloride: 105 mEq/L (ref 96–112)
GFR calc Af Amer: 14 mL/min — ABNORMAL LOW (ref >90)
GFR calc non Af Amer: 12 mL/min — ABNORMAL LOW (ref >90)
Glucose, Bld: 167 mg/dL — ABNORMAL HIGH (ref 70–99)
Potassium: 4 mEq/L (ref 3.7–5.3)
Sodium: 142 mEq/L (ref 137–147)
Total Protein: 5.9 g/dL — ABNORMAL LOW (ref 6.0–8.3)

## 2013-11-25 LAB — AMMONIA: AMMONIA: 23 umol/L (ref 11–60)

## 2013-11-25 LAB — URINALYSIS, ROUTINE W REFLEX MICROSCOPIC
Bilirubin Urine: NEGATIVE
Glucose, UA: 100 mg/dL — AB
Leukocytes, UA: NEGATIVE
Nitrite: NEGATIVE
Protein, ur: >300 mg/dL — AB
Specific Gravity, Urine: 1.02 (ref 1.005–1.030)
Urobilinogen, UA: 0.2 mg/dL (ref 0.0–1.0)
pH: 6 (ref 5.0–8.0)

## 2013-11-25 LAB — BLOOD GAS, ARTERIAL
Acid-base deficit: 1.3 mmol/L (ref 0.0–2.0)
Bicarbonate: 23.5 mEq/L (ref 20.0–24.0)
DRAWN BY: 22223
FIO2: 21 %
O2 Saturation: 97.2 %
PATIENT TEMPERATURE: 37
TCO2: 22 mmol/L (ref 0–100)
pCO2 arterial: 43.9 mmHg (ref 35.0–45.0)
pH, Arterial: 7.348 — ABNORMAL LOW (ref 7.350–7.450)
pO2, Arterial: 95 mmHg (ref 80.0–100.0)

## 2013-11-25 LAB — CBG MONITORING, ED
Glucose-Capillary: 194 mg/dL — ABNORMAL HIGH (ref 70–99)
Glucose-Capillary: 101 mg/dL — ABNORMAL HIGH (ref 70–99)

## 2013-11-25 LAB — URINE MICROSCOPIC-ADD ON

## 2013-11-25 LAB — PROTIME-INR
INR: 1.09 (ref 0.00–1.49)
PROTHROMBIN TIME: 14.1 s (ref 11.6–15.2)

## 2013-11-25 LAB — RAPID URINE DRUG SCREEN, HOSP PERFORMED
AMPHETAMINES: NOT DETECTED
Barbiturates: NOT DETECTED
Benzodiazepines: NOT DETECTED
COCAINE: NOT DETECTED
OPIATES: NOT DETECTED
Tetrahydrocannabinol: NOT DETECTED

## 2013-11-25 LAB — PHENYTOIN LEVEL, TOTAL: Phenytoin Lvl: <2.5 ug/mL — ABNORMAL LOW (ref 10.0–20.0)

## 2013-11-25 LAB — APTT: APTT: 35 s (ref 24–37)

## 2013-11-25 LAB — ETHANOL: Alcohol, Ethyl (B): <11 mg/dL (ref 0–11)

## 2013-11-25 MED ORDER — SODIUM CHLORIDE 0.9 % IV SOLN
1000.0000 mL | INTRAVENOUS | Status: DC
  Administered 2013-11-25 – 2013-11-26 (×2): 1000 mL via INTRAVENOUS

## 2013-11-25 NOTE — ED Provider Notes (Signed)
CSN: [id number]     Arrival date & time 11/25/13  1632 History  This chart was scribed for Dorie Rank, MD by Jeanell Sparrow, ED Scribe. This patient was seen in room APA08/APA08 and the patient's care was started at 4:49 PM.   Chief Complaint  Patient presents with  . Altered Mental Status   Level 5 Caveat due to Altered Mental Status   HPI HPI Comments: LAYN KYE is a 63 y.o. male brought in by ambulance, who presents to the Emergency Department complaining of altered mental status. EMS states that pt's altered mental status started this morning. EMS reports that pt's family reports that pt has not been eating for 2 days. EMS also states that pt's house has been very hot and unclean. Patient will only answer questions intermittently by nodding his head. He denies any pain. He denies chest pain or shortness of breath.  Past Medical History  Diagnosis Date  . Diabetes mellitus without complication   . Hypertension   . Chronic anemia   . Stroke   . Seizures   . Cancer   . Renal insufficiency    Past Surgical History  Procedure Laterality Date  . Bascilic vein transposition Left 08/09/2013    Procedure: LEFT 1ST STAGE BRACHIAL VEIN TRANSPOSITION;  Surgeon: Conrad [place], MD;  Location: [facility] OR;  Service: Vascular;  Laterality: Left;   Family History  Problem Relation Age of Onset  . Diabetes Mother   . Hypertension Mother   . Diabetes Father   . Hypertension Father    History  Substance Use Topics  . Smoking status: Former Smoker    Quit date: 02/02/2013  . Smokeless tobacco: Never Used  . Alcohol Use: No    Review of Systems  Unable to perform ROS: Mental status change    Allergies  Review of patient's allergies indicates no known allergies.  Home Medications   Prior to Admission medications   Medication Sig Start Date End Date Taking? Authorizing Provider  amLODipine (NORVASC) 10 MG tablet Take 10 mg by mouth every morning.     Historical Provider, MD  fluticasone  (FLONASE) 50 MCG/ACT nasal spray Place 2 sprays into both nostrils at bedtime.    Historical Provider, MD  lidocaine-prilocaine (EMLA) cream Apply a quarter size amount to port site 1 hour prior to chemo. Do not rub in. Cover with plastic wrap. 11/20/13   Farrel Gobble, MD  metoprolol tartrate (LOPRESSOR) 25 MG tablet Take 25 mg by mouth 2 (two) times daily.    Historical Provider, MD  oxyCODONE (ROXICODONE) 5 MG/5ML solution 1 or 2 teaspoon full surgery 4 hours to control pain 11/16/13   Farrel Gobble, MD  phenytoin (DILANTIN) 100 MG ER capsule Take 400 mg by mouth at bedtime.     Historical Provider, MD  prochlorperazine (COMPAZINE) 10 MG tablet Take 1 tablet (10 mg total) by mouth every 6 (six) hours as needed for nausea or vomiting. 11/20/13   Farrel Gobble, MD  simvastatin (ZOCOR) 10 MG tablet Take 10 mg by mouth at bedtime.    Historical Provider, MD   BP 167/70  Pulse 76  Temp(Src) 97.6 F (36.4 C) (Oral)  Resp 16  Ht 5\' 11"  (1.803 m)  Wt 120 lb (54.432 kg)  BMI 16.74 kg/m2  SpO2 100% Physical Exam  Nursing note and vitals reviewed. Constitutional: No distress.  Thin, frail   HENT:  Head: Normocephalic and atraumatic.  Right Ear: External ear normal.  Left Ear: External ear  normal.  Mouth/Throat: No oropharyngeal exudate.  Small amount of blood in the oropharynx  Eyes: Conjunctivae are normal. Right eye exhibits no discharge. Left eye exhibits no discharge. No scleral icterus.  Neck: Neck supple. No tracheal deviation present. No mass present.  Bilateral neck mass  Cardiovascular: Normal rate, regular rhythm and intact distal pulses.   Pulmonary/Chest: Effort normal and breath sounds normal. No stridor. No respiratory distress. He has no wheezes. He has no rales.  Abdominal: Soft. Bowel sounds are normal. He exhibits no distension. There is no tenderness. There is no rebound and no guarding.  Musculoskeletal: He exhibits no edema and no tenderness.  Lymphadenopathy:     He has cervical adenopathy.  Neurological: He is alert. He displays no tremor. No cranial nerve deficit (no facial droop, extraocular movements intact) or sensory deficit. He exhibits abnormal muscle tone. He displays no seizure activity. GCS eye subscore is 4. GCS verbal subscore is 1. GCS motor subscore is 6.  Pt does move his hands but does not follow commands, will not move arms or legs when asked,  Did open his mouth when asked to do so  Skin: Skin is warm and dry. No rash noted. He is not diaphoretic.  Psychiatric: He has a normal mood and affect.    ED Course  Procedures (including critical care time) DIAGNOSTIC STUDIES: Oxygen Saturation is 100% on RA, normal by my interpretation.    COORDINATION OF CARE: 4:53 PM- Pt advised of plan for treatment which includes labs and pt agrees.  Labs Review Labs Reviewed  CBC - Abnormal; Notable for the following:    RBC 3.15 (*)    Hemoglobin 9.7 (*)    HCT 29.0 (*)    RDW 16.9 (*)    Platelets 124 (*)    All other components within normal limits  COMPREHENSIVE METABOLIC PANEL - Abnormal; Notable for the following:    Glucose, Bld 167 (*)    BUN 34 (*)    Creatinine, Ser 4.78 (*)    Total Protein 5.9 (*)    Albumin 1.6 (*)    Total Bilirubin 0.2 (*)    GFR calc non Af Amer 12 (*)    GFR calc Af Amer 14 (*)    All other components within normal limits  CBG MONITORING, ED - Abnormal; Notable for the following:    Glucose-Capillary 194 (*)    All other components within normal limits  CBG MONITORING, ED - Abnormal; Notable for the following:    Glucose-Capillary 101 (*)    All other components within normal limits  APTT  PROTIME-INR  AMMONIA  ETHANOL  URINALYSIS, ROUTINE W REFLEX MICROSCOPIC  URINE RAPID DRUG SCREEN (HOSP PERFORMED)  TYPE AND SCREEN    Imaging Review Dg Chest 1 View  11/25/2013   CLINICAL DATA:  Altered mental status  EXAM: CHEST - 1 VIEW  COMPARISON:  09/13/2013  FINDINGS: Right-sided Port-A-Cath  terminates at the mid to low SVC.  Midline trachea. Borderline cardiomegaly. No pleural effusion or pneumothorax. No congestive failure.  IMPRESSION: Borderline cardiomegaly, without acute disease.   Electronically Signed   By: Abigail Miyamoto M.D.   On: 11/25/2013 17:58   Ct Head Wo Contrast  11/25/2013   CLINICAL DATA:  Altered mental status. 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  EXAM: CT HEAD WITHOUT CONTRAST  TECHNIQUE: Contiguous axial images were obtained from the base of the skull through the vertex without intravenous contrast.  COMPARISON:  None.  FINDINGS: Large  area of encephalomalacia from prior right MCA infarct. No acute intracranial abnormality. Specifically, no hemorrhage, hydrocephalus, mass lesion, acute infarction, or significant intracranial injury. No acute calvarial abnormality. Visualized paranasal sinuses and mastoids clear. Orbital soft tissues unremarkable.  Soft tissue mass partially imaged in the left upper lateral neck, presumably known soft tissue metastasis from patient's primary soft tissue cancer.  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 No acute intracranial abnormality.  Large upper left lateral neck mass, presumed known metastasis.   Electronically Signed   By: Rolm Baptise M.D.   On: 11/25/2013 18:12     EKG Interpretation   Date/Time:  Saturday November 25 2013 16:55:48 EDT Ventricular Rate:  81 PR Interval:  141 QRS Duration: 93 QT Interval:  454 QTC Calculation: 527 R Axis:   77 Text Interpretation:  Sinus rhythm Anterior infarct, age indeterminate  Prolonged QT interval No significant change since last tracing Confirmed  by Naquisha Whitehair  MD-J, Clevon Khader ([id number]) on 11/25/2013 5:02:45 PM      MDM   Final diagnoses:  Hypoglycemia  Confusion   2000  Pt is alert and awake, oriented times three.  Pt states he thinks his sugar was low.  He feels much better after having had something to eat.  Cannot exclude the possibility of a seizure  however he did have a low blood sugar of 49.  Pt not on hypoglycemic agents.    2132  Dicussed findings with family, now that they are here.  They feel he is still confused intermittently.  Not responding when they are trying to talk to him sometimes.  Family states he is not taking dilantin any more. Question whether he has had some seizures.  Will consult with medical service regarding overnight observation with his confusion.  I personally performed the services described in this documentation, which was scribed in my presence.  The recorded information has been reviewed and is accurate.     Dorie Rank, MD 11/25/13 2133

## 2013-11-25 NOTE — ED Notes (Signed)
Pt able to move self back in the bed. Answering questions and interacting with family. Nad noted at present.

## 2013-11-25 NOTE — ED Notes (Signed)
MD at bedside. 

## 2013-11-25 NOTE — ED Notes (Signed)
Pt able to sit up in the bed and help remove shirt; following commands and responding to yes or no questions.

## 2013-11-25 NOTE — ED Notes (Signed)
Pt attempted to use urinal with no success. Giving fluids through port, will attempt to obtain urine sample again.

## 2013-11-25 NOTE — ED Notes (Signed)
Pt states he cannot urinate at the present. When discussing the possibility of an in-and-out catheter, pt stated he did not want that. Pt reports that he makes very little urine.

## 2013-11-25 NOTE — ED Notes (Addendum)
Per EMS, altered mental status since this am. Per pt family at time of EMS arrival to pt home, reports that pt has not eating x2 days. Per EMS, pt house very hot and unclean conditions. Pt was just released from hospital on Wednesday. Pt alert to self. CBG initially 49 on EMS arrival. Pt received sugar water and instant glucose, 25 mg of D50 with minimal change. CBG 250 at time of EMS arrival. Per EMS, pt may have had a seizure prior to there arrival. Small abrasion noted to tongue. No active bleeding. Pt and pt family poor historians.

## 2013-11-25 NOTE — ED Notes (Signed)
Hospitalist at bedside 

## 2013-11-25 NOTE — Discharge Instructions (Signed)
Hypoglycemia °Hypoglycemia occurs when the glucose in your blood is too low. Glucose is a type of sugar that is your body's main energy source. Hormones, such as insulin and glucagon, control the level of glucose in the blood. Insulin lowers blood glucose and glucagon increases blood glucose. Having too much insulin in your blood stream, or not eating enough food containing sugar, can result in hypoglycemia. Hypoglycemia can happen to people with or without diabetes. It can develop quickly and can be a medical emergency.  °CAUSES  °· Missing or delaying meals. °· Not eating enough carbohydrates at meals. °· Taking too much diabetes medicine. °· Not timing your oral diabetes medicine or insulin doses with meals, snacks, and exercise. °· Nausea and vomiting. °· Certain medicines. °· Severe illnesses, such as hepatitis, kidney disorders, and certain eating disorders. °· Increased activity or exercise without eating something extra or adjusting medicines. °· Drinking too much alcohol. °· A nerve disorder that affects body functions like your heart rate, blood pressure, and digestion (autonomic neuropathy). °· A condition where the stomach muscles do not function properly (gastroparesis). Therefore, medicines and food may not absorb properly. °· Rarely, a tumor of the pancreas can produce too much insulin. °SYMPTOMS  °· Hunger. °· Sweating (diaphoresis). °· Change in body temperature. °· Shakiness. °· Headache. °· Anxiety. °· Lightheadedness. °· Irritability. °· Difficulty concentrating. °· Dry mouth. °· Tingling or numbness in the hands or feet. °· Restless sleep or sleep disturbances. °· Altered speech and coordination. °· Change in mental status. °· Seizures or prolonged convulsions. °· Combativeness. °· Drowsiness (lethargic). °· Weakness. °· Increased heart rate or palpitations. °· Confusion. °· Pale, gray skin color. °· Blurred or double vision. °· Fainting. °DIAGNOSIS  °A physical exam and medical history will be  performed. Your caregiver may make a diagnosis based on your symptoms. Blood tests and other lab tests may be performed to confirm a diagnosis. Once the diagnosis is made, your caregiver will see if your signs and symptoms go away once your blood glucose is raised.  °TREATMENT  °Usually, you can easily treat your hypoglycemia when you notice symptoms. °· Check your blood glucose. If it is less than 70 mg/dl, take one of the following:   °¨ 3-4 glucose tablets.   °¨ ½ cup juice.   °¨ ½ cup regular soda.   °¨ 1 cup skim milk.   °¨ ½-1 tube of glucose gel.   °¨ 5-6 hard candies.   °· Avoid high-fat drinks or food that may delay a rise in blood glucose levels. °· Do not take more than the recommended amount of sugary foods, drinks, gel, or tablets. Doing so will cause your blood glucose to go too high.   °· Wait 10-15 minutes and recheck your blood glucose. If it is still less than 70 mg/dl or below your target range, repeat treatment.   °· Eat a snack if it is more than 1 hour until your next meal.   °There may be a time when your blood glucose may go so low that you are unable to treat yourself at home when you start to notice symptoms. You may need someone to help you. You may even faint or be unable to swallow. If you cannot treat yourself, someone will need to bring you to the hospital.  °HOME CARE INSTRUCTIONS °· If you have diabetes, follow your diabetes management plan by: °¨ Taking your medicines as directed. °¨ Following your exercise plan. °¨ Following your meal plan. Do not skip meals. Eat on time. °¨ Testing your blood   glucose regularly. Check your blood glucose before and after exercise. If you exercise longer or different than usual, be sure to check blood glucose more frequently. °¨ Wearing your medical alert jewelry that says you have diabetes. °· Identify the cause of your hypoglycemia. Then, develop ways to prevent the recurrence of hypoglycemia. °· Do not take a hot bath or shower right after an  insulin shot. °· Always carry treatment with you. Glucose tablets are the easiest to carry. °· If you are going to drink alcohol, drink it only with meals. °· Tell friends or family members ways to keep you safe during a seizure. This may include removing hard or sharp objects from the area or turning you on your side. °· Maintain a healthy weight. °SEEK MEDICAL CARE IF:  °· You are having problems keeping your blood glucose in your target range. °· You are having frequent episodes of hypoglycemia. °· You feel you might be having side effects from your medicines. °· You are not sure why your blood glucose is dropping so low. °· You notice a change in vision or a new problem with your vision. °SEEK IMMEDIATE MEDICAL CARE IF:  °· Confusion develops. °· A change in mental status occurs. °· The inability to swallow develops. °· Fainting occurs. °Document Released: 03/30/2005 Document Revised: 04/04/2013 Document Reviewed: 07/27/2011 °[employer]® Patient Information ©2015 [employer]. This information is not intended to replace advice given to you by your health care provider. Make sure you discuss any questions you have with your health care provider. ° °

## 2013-11-26 ENCOUNTER — Encounter (HOSPITAL_COMMUNITY): Payer: Self-pay

## 2013-11-26 DIAGNOSIS — C051 Malignant neoplasm of soft palate: Secondary | ICD-10-CM | POA: Diagnosis not present

## 2013-11-26 DIAGNOSIS — G40909 Epilepsy, unspecified, not intractable, without status epilepticus: Secondary | ICD-10-CM | POA: Diagnosis not present

## 2013-11-26 DIAGNOSIS — G9340 Encephalopathy, unspecified: Secondary | ICD-10-CM | POA: Diagnosis present

## 2013-11-26 DIAGNOSIS — T68XXXA Hypothermia, initial encounter: Secondary | ICD-10-CM | POA: Diagnosis not present

## 2013-11-26 DIAGNOSIS — N179 Acute kidney failure, unspecified: Secondary | ICD-10-CM | POA: Diagnosis not present

## 2013-11-26 DIAGNOSIS — R404 Transient alteration of awareness: Secondary | ICD-10-CM

## 2013-11-26 DIAGNOSIS — N184 Chronic kidney disease, stage 4 (severe): Secondary | ICD-10-CM | POA: Diagnosis not present

## 2013-11-26 DIAGNOSIS — E870 Hyperosmolality and hypernatremia: Secondary | ICD-10-CM | POA: Diagnosis not present

## 2013-11-26 DIAGNOSIS — Z8673 Personal history of transient ischemic attack (TIA), and cerebral infarction without residual deficits: Secondary | ICD-10-CM | POA: Diagnosis not present

## 2013-11-26 DIAGNOSIS — Z8249 Family history of ischemic heart disease and other diseases of the circulatory system: Secondary | ICD-10-CM | POA: Diagnosis not present

## 2013-11-26 DIAGNOSIS — E43 Unspecified severe protein-calorie malnutrition: Secondary | ICD-10-CM | POA: Diagnosis not present

## 2013-11-26 DIAGNOSIS — R680 Hypothermia, not associated with low environmental temperature: Secondary | ICD-10-CM | POA: Diagnosis not present

## 2013-11-26 DIAGNOSIS — Z833 Family history of diabetes mellitus: Secondary | ICD-10-CM | POA: Diagnosis not present

## 2013-11-26 DIAGNOSIS — Z87891 Personal history of nicotine dependence: Secondary | ICD-10-CM | POA: Diagnosis not present

## 2013-11-26 DIAGNOSIS — I129 Hypertensive chronic kidney disease with stage 1 through stage 4 chronic kidney disease, or unspecified chronic kidney disease: Secondary | ICD-10-CM | POA: Diagnosis not present

## 2013-11-26 DIAGNOSIS — E872 Acidosis, unspecified: Secondary | ICD-10-CM | POA: Diagnosis present

## 2013-11-26 DIAGNOSIS — J9600 Acute respiratory failure, unspecified whether with hypoxia or hypercapnia: Secondary | ICD-10-CM | POA: Diagnosis not present

## 2013-11-26 DIAGNOSIS — Z681 Body mass index (BMI) 19 or less, adult: Secondary | ICD-10-CM | POA: Diagnosis not present

## 2013-11-26 DIAGNOSIS — I498 Other specified cardiac arrhythmias: Secondary | ICD-10-CM | POA: Diagnosis not present

## 2013-11-26 DIAGNOSIS — C50919 Malignant neoplasm of unspecified site of unspecified female breast: Secondary | ICD-10-CM | POA: Diagnosis not present

## 2013-11-26 DIAGNOSIS — G9341 Metabolic encephalopathy: Secondary | ICD-10-CM | POA: Diagnosis present

## 2013-11-26 DIAGNOSIS — R4182 Altered mental status, unspecified: Secondary | ICD-10-CM | POA: Diagnosis present

## 2013-11-26 DIAGNOSIS — E162 Hypoglycemia, unspecified: Secondary | ICD-10-CM | POA: Diagnosis not present

## 2013-11-26 LAB — COMPREHENSIVE METABOLIC PANEL
ALBUMIN: 1.7 g/dL — AB (ref 3.5–5.2)
ALK PHOS: 103 U/L (ref 39–117)
AST: 12 U/L (ref 0–37)
Anion gap: 13 (ref 5–15)
BILIRUBIN TOTAL: 0.2 mg/dL — AB (ref 0.3–1.2)
BUN: 32 mg/dL — ABNORMAL HIGH (ref 6–23)
CHLORIDE: 110 meq/L (ref 96–112)
CO2: 21 mEq/L (ref 19–32)
Calcium: 8.3 mg/dL — ABNORMAL LOW (ref 8.4–10.5)
Creatinine, Ser: 4.34 mg/dL — ABNORMAL HIGH (ref 0.50–1.35)
GFR calc Af Amer: 16 mL/min — ABNORMAL LOW (ref >90)
GFR, EST NON AFRICAN AMERICAN: 13 mL/min — AB (ref >90)
Glucose, Bld: 165 mg/dL — ABNORMAL HIGH (ref 70–99)
POTASSIUM: 4.2 meq/L (ref 3.7–5.3)
Sodium: 144 mEq/L (ref 137–147)
Total Protein: 6.1 g/dL (ref 6.0–8.3)

## 2013-11-26 LAB — GLUCOSE, CAPILLARY
GLUCOSE-CAPILLARY: 171 mg/dL — AB (ref 70–99)
Glucose-Capillary: 94 mg/dL (ref 70–99)
Glucose-Capillary: 150 mg/dL — ABNORMAL HIGH (ref 70–99)
Glucose-Capillary: 163 mg/dL — ABNORMAL HIGH (ref 70–99)
Glucose-Capillary: 121 mg/dL — ABNORMAL HIGH (ref 70–99)

## 2013-11-26 LAB — CBC
HEMATOCRIT: 30.7 % — AB (ref 39.0–52.0)
Hemoglobin: 10.1 g/dL — ABNORMAL LOW (ref 13.0–17.0)
MCH: 30.2 pg (ref 26.0–34.0)
MCHC: 32.9 g/dL (ref 30.0–36.0)
MCV: 91.9 fL (ref 78.0–100.0)
PLATELETS: 140 10*3/uL — AB (ref 150–400)
RBC: 3.34 MIL/uL — AB (ref 4.22–5.81)
RDW: 16.8 % — AB (ref 11.5–15.5)
WBC: 12.5 10*3/uL — AB (ref 4.0–10.5)

## 2013-11-26 LAB — LACTIC ACID, PLASMA: LACTIC ACID, VENOUS: 1.2 mmol/L (ref 0.5–2.2)

## 2013-11-26 LAB — TSH: TSH: 0.72 u[IU]/mL (ref 0.350–4.500)

## 2013-11-26 MED ORDER — AMLODIPINE BESYLATE 5 MG PO TABS
10.0000 mg | ORAL_TABLET | Freq: Every morning | ORAL | Status: DC
  Administered 2013-11-27: 10 mg via ORAL
  Filled 2013-11-26 (×4): qty 2

## 2013-11-26 MED ORDER — PROCHLORPERAZINE MALEATE 5 MG PO TABS: 10.0000 mg | ORAL_TABLET | Freq: Four times a day (QID) | ORAL | Status: DC | PRN

## 2013-11-26 MED ORDER — DOCUSATE SODIUM 100 MG PO CAPS: 100.0000 mg | ORAL_CAPSULE | Freq: Every day | ORAL | Status: DC | PRN

## 2013-11-26 MED ORDER — ENOXAPARIN SODIUM 30 MG/0.3ML ~~LOC~~ SOLN
30.0000 mg | SUBCUTANEOUS | Status: DC
  Administered 2013-11-26 – 2013-11-28 (×3): 30 mg via SUBCUTANEOUS
  Filled 2013-11-26 (×3): qty 0.3

## 2013-11-26 MED ORDER — PIPERACILLIN-TAZOBACTAM IN DEX 2-0.25 GM/50ML IV SOLN
2.2500 g | Freq: Three times a day (TID) | INTRAVENOUS | Status: DC
  Administered 2013-11-26 – 2013-11-28 (×7): 2.25 g via INTRAVENOUS
  Filled 2013-11-26 (×10): qty 50

## 2013-11-26 MED ORDER — PHENYTOIN SODIUM 50 MG/ML IJ SOLN
100.0000 mg | Freq: Three times a day (TID) | INTRAMUSCULAR | Status: DC
  Administered 2013-11-26 – 2013-11-28 (×7): 100 mg via INTRAVENOUS
  Filled 2013-11-26 (×7): qty 2

## 2013-11-26 MED ORDER — VANCOMYCIN HCL IN DEXTROSE 1-5 GM/200ML-% IV SOLN
1000.0000 mg | Freq: Once | INTRAVENOUS | Status: AC
  Administered 2013-11-26: 1000 mg via INTRAVENOUS
  Filled 2013-11-26: qty 200

## 2013-11-26 MED ORDER — PHENYTOIN SODIUM EXTENDED 100 MG PO CAPS: 400.0000 mg | ORAL_CAPSULE | Freq: Every day | ORAL | Status: DC

## 2013-11-26 MED ORDER — ACETAMINOPHEN 650 MG RE SUPP: 650.0000 mg | Freq: Four times a day (QID) | RECTAL | Status: DC | PRN

## 2013-11-26 MED ORDER — ALUM & MAG HYDROXIDE-SIMETH 200-200-20 MG/5ML PO SUSP: 30.0000 mL | Freq: Four times a day (QID) | ORAL | Status: DC | PRN

## 2013-11-26 MED ORDER — SODIUM CHLORIDE 0.9 % IV SOLN
1000.0000 mg | Freq: Once | INTRAVENOUS | Status: AC
  Administered 2013-11-26: 1000 mg via INTRAVENOUS
  Filled 2013-11-26: qty 20

## 2013-11-26 MED ORDER — METOPROLOL TARTRATE 1 MG/ML IV SOLN
10.0000 mg | Freq: Once | INTRAVENOUS | Status: AC
  Administered 2013-11-26: 10 mg via INTRAVENOUS
  Filled 2013-11-26: qty 10

## 2013-11-26 MED ORDER — SODIUM CHLORIDE 0.9 % IJ SOLN
3.0000 mL | Freq: Two times a day (BID) | INTRAMUSCULAR | Status: DC
  Administered 2013-11-26: 10 mL via INTRAVENOUS
  Administered 2013-11-26 – 2013-11-28 (×3): 3 mL via INTRAVENOUS

## 2013-11-26 MED ORDER — OXYCODONE HCL 5 MG PO TABS: 5.0000 mg | ORAL_TABLET | ORAL | Status: DC | PRN

## 2013-11-26 MED ORDER — PHENYTOIN SODIUM 50 MG/ML IJ SOLN
INTRAMUSCULAR | Status: AC
  Filled 2013-11-26: qty 2

## 2013-11-26 MED ORDER — METOPROLOL TARTRATE 25 MG PO TABS
25.0000 mg | ORAL_TABLET | Freq: Two times a day (BID) | ORAL | Status: DC
  Administered 2013-11-26: 25 mg via ORAL
  Filled 2013-11-26: qty 1

## 2013-11-26 MED ORDER — ACETAMINOPHEN 325 MG PO TABS: 650.0000 mg | ORAL_TABLET | Freq: Four times a day (QID) | ORAL | Status: DC | PRN

## 2013-11-26 MED ORDER — LORAZEPAM 2 MG/ML IJ SOLN
0.5000 mg | Freq: Four times a day (QID) | INTRAMUSCULAR | Status: DC | PRN
  Administered 2013-11-26 – 2013-11-28 (×2): 0.5 mg via INTRAVENOUS
  Filled 2013-11-26 (×2): qty 1

## 2013-11-26 MED ORDER — HYDRALAZINE HCL 20 MG/ML IJ SOLN
10.0000 mg | Freq: Four times a day (QID) | INTRAMUSCULAR | Status: DC | PRN
  Administered 2013-11-28: 10 mg via INTRAVENOUS
  Filled 2013-11-26: qty 1

## 2013-11-26 MED ORDER — FLUTICASONE PROPIONATE 50 MCG/ACT NA SUSP
2.0000 | Freq: Every day | NASAL | Status: DC
  Administered 2013-11-27: 2 via NASAL
  Filled 2013-11-26 (×2): qty 16

## 2013-11-26 MED ORDER — SIMVASTATIN 20 MG PO TABS
10.0000 mg | ORAL_TABLET | Freq: Every day | ORAL | Status: DC
  Administered 2013-11-26: 10 mg via ORAL
  Filled 2013-11-26 (×3): qty 1

## 2013-11-26 MED ORDER — SODIUM CHLORIDE 0.9 % IV SOLN
1000.0000 mL | INTRAVENOUS | Status: DC
  Administered 2013-11-26 (×2): 1000 mL via INTRAVENOUS

## 2013-11-26 NOTE — ED Notes (Signed)
AC notified of need for Dilantin IVPB

## 2013-11-26 NOTE — Progress Notes (Signed)
Pt is still very confused this morning. He will respond to voice at times, but sometimes needs painful stimulus to respond. He seems to oriented to himself, but has no idea where he is, the date, or situation. HR is bradycardic in the 50s and BPs are still elevated. Pt is refusing his medications, mostly because he is confused. When he is offered water or medication his reply is "no, I have sugar". Pt is not able to understand any education at this point and can not be reasoned with to take his BP meds. MD has been made aware. Will continue to monitor.

## 2013-11-26 NOTE — H&P (Signed)
History and Physical  Mark Sanford:[id number] DOB: 23-Mar-1951 DOA: 11/25/2013  Referring physician: emergency Department PCP: Dionisio Paschal, NP   Chief Complaint: altered mental status  HPI: Mark Sanford is a 63 y.o. male  With a history of metastatic oral pharyngeal squamous cell carcinoma, and a 2 diabetes, seizure disorder, stroke. He presents to the wrist arm and with altered mental status since earlier today. The history is given by the patient's girlfriend who lives with the patient. The patient had a port placed for chemotherapy for his squamous cell carcinoma on Wednesday. Since returning home later that evening, his girlfriend notes that he has had noisy breathing and has laid in bed for most of the time. His breathing has become more labored and noisy, particularly as he breathes through his nose.  He does have some abdomen periods that last for a few seconds. His appetite has been decreased as well. This evening, he became unresponsive. EMS was called and blood sugar and discovered that his blood sugar was in the 40s. Oral glucose was given, raising his blood sugar, but did not particularly improve his symptoms. His girlfriend noted no shaking episodes at that time. In the emergency department, he has become more responsive and interactive, although at rest, he still develops episodes.  His girlfriend relates that he has not taken any of his medications for a few days.  Review of Systems:  Patient denies fevers, chills, nausea, vomiting, chest pain, cough, wheezing, shortness of breath.  Past Medical History  Diagnosis Date  . Diabetes mellitus without complication   . Hypertension   . Chronic anemia   . Stroke   . Seizures   . Cancer   . Renal insufficiency    Past Surgical History  Procedure Laterality Date  . Bascilic vein transposition Left 08/09/2013    Procedure: LEFT 1ST STAGE BRACHIAL VEIN TRANSPOSITION;  Surgeon: Conrad Greenevers, MD;  Location: [facility];  Service:  Vascular;  Laterality: Left;   Social History:  reports that he quit smoking about 9 months ago. He has never used smokeless tobacco. He reports that he does not drink alcohol or use illicit drugs. Patient lives at home & is able to participate in activities of daily living with minimal assistance normally  No Known Allergies  Family History  Problem Relation Age of Onset  . Diabetes Mother   . Hypertension Mother   . Diabetes Father   . Hypertension Father       Prior to Admission medications   Medication Sig Start Date End Date Taking? Authorizing Provider  amLODipine (NORVASC) 10 MG tablet Take 10 mg by mouth every morning.     Historical Provider, MD  fluticasone (FLONASE) 50 MCG/ACT nasal spray Place 2 sprays into both nostrils at bedtime.    Historical Provider, MD  lidocaine-prilocaine (EMLA) cream Apply a quarter size amount to port site 1 hour prior to chemo. Do not rub in. Cover with plastic wrap. 11/20/13   Farrel Gobble, MD  metoprolol tartrate (LOPRESSOR) 25 MG tablet Take 25 mg by mouth 2 (two) times daily.    Historical Provider, MD  oxyCODONE (ROXICODONE) 5 MG/5ML solution 1 or 2 teaspoon full surgery 4 hours to control pain 11/16/13   Farrel Gobble, MD  phenytoin (DILANTIN) 100 MG ER capsule Take 400 mg by mouth at bedtime.     Historical Provider, MD  prochlorperazine (COMPAZINE) 10 MG tablet Take 1 tablet (10 mg total) by mouth every 6 (six) hours as needed  for nausea or vomiting. 11/20/13   Farrel Gobble, MD  simvastatin (ZOCOR) 10 MG tablet Take 10 mg by mouth at bedtime.    Historical Provider, MD    Physical Exam: BP 220/89  Pulse 82  Temp(Src) 97.6 F (36.4 C) (Oral)  Resp 13  Ht 5\' 11"  (1.803 m)  Wt 54.432 kg (120 lb)  BMI 16.74 kg/m2  SpO2 100%  General:  Elderly black male who is awake and alert and oriented x3. No cardiopulmonary distress although his breathing mostly through his nose and has some apneic episodes Eyes: pupils equal round  reactive to light, extraocular muscles are intact. ENT: external auditory canals are patent. Tympanic membrane reflects a good cone of light. Neck: there is a large mass protruding from the the left side of the patient's neck Cardiovascular: regular rate with normal S1-S2 sounds. No murmurs, rubs, gallops auscultated. No jugular venous distention Respiratory: lungs are clear to auscultation bilaterally. Abdomen: soft, nontender, nondistended. Skin: no rashes preserved the hip Musculoskeletal: all major joints are nontender and non-erythematous Psychiatric: minimal responsive to questioning although response with persistence. Neurologic: no focal neurological deficits observed.          Labs on Admission:  Basic Metabolic Panel:  Recent Labs Lab 11/22/13 1341 11/25/13 1715  NA 137 142  K 4.5 4.0  CL 103 105  CO2 21 22  GLUCOSE 75 167*  BUN 29* 34*  CREATININE 4.32* 4.78*  CALCIUM 8.5 8.7   Liver Function Tests:  Recent Labs Lab 11/25/13 1715  AST 11  ALT 5  ALKPHOS 97  BILITOT 0.2*  PROT 5.9*  ALBUMIN 1.6*   No results found for this basename: LIPASE, AMYLASE,  in the last 168 hours  Recent Labs Lab 11/25/13 1717  AMMONIA 23   CBC:  Recent Labs Lab 11/22/13 1341 11/25/13 1715  WBC 11.4* 9.5  HGB 10.5* 9.7*  HCT 31.0* 29.0*  MCV 89.3 92.1  PLT 128* 124*   Cardiac Enzymes: No results found for this basename: CKTOTAL, CKMB, CKMBINDEX, TROPONINI,  in the last 168 hours  BNP (last 3 results) No results found for this basename: PROBNP,  in the last 8760 hours CBG:  Recent Labs Lab 11/22/13 1541 11/22/13 1610 11/22/13 1702 11/25/13 1654 11/25/13 2016  GLUCAP 69* 67* 121* 194* 101*    Radiological Exams on Admission: Dg Chest 1 View  11/25/2013   CLINICAL DATA:  Altered mental status  EXAM: CHEST - 1 VIEW  COMPARISON:  09/13/2013  FINDINGS: Right-sided Port-A-Cath terminates at the mid to low SVC.  Midline trachea. Borderline cardiomegaly. No  pleural effusion or pneumothorax. No congestive failure.  IMPRESSION: Borderline cardiomegaly, without acute disease.   Electronically Signed   By: Abigail Miyamoto M.D.   On: 11/25/2013 17:58   Ct Head Wo Contrast  11/25/2013   CLINICAL DATA:  Altered mental status. 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  EXAM: CT HEAD WITHOUT CONTRAST  TECHNIQUE: Contiguous axial images were obtained from the base of the skull through the vertex without intravenous contrast.  COMPARISON:  None.  FINDINGS: Large area of encephalomalacia from prior right MCA infarct. No acute intracranial abnormality. Specifically, no hemorrhage, hydrocephalus, mass lesion, acute infarction, or significant intracranial injury. No acute calvarial abnormality. Visualized paranasal sinuses and mastoids clear. Orbital soft tissues unremarkable.  Soft tissue mass partially imaged in the left upper lateral neck, presumably known soft tissue metastasis from patient's primary soft tissue cancer.  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  No acute intracranial abnormality.  Large upper left lateral neck mass, presumed known metastasis.   Electronically Signed   By: Rolm Baptise M.D.   On: 11/25/2013 18:12   Ct Soft Tissue Neck Wo Contrast  11/25/2013   CLINICAL DATA:  Left neck swelling. History of cancer. End-stage renal disease.  EXAM: CT NECK WITHOUT CONTRAST  TECHNIQUE: Multidetector CT imaging of the neck was performed following the standard protocol without intravenous contrast.  COMPARISON:  CT of the head November 25, 2013 at 1810 hr.  FINDINGS: Limited noncontrast CT of the neck.  At least 4.2 x 3 cm mass within the oropharynx, this appears to arise from the left palatine tonsil, though there is abnormal fullness of the left nasopharyngeal soft tissues/ torus tubarius. Preservation the left parapharyngeal fat tissue planes. Mass effaces the oropharynx. No extension to the hypopharynx. The larynx is unremarkable.   Massive left greater than right cervical lymphadenopathy, including a 7.8 x 5.8 cm necrotic mass inseparable from the superior aspect of the sternocleidomastoid muscle, extending into the carotid space with additional probable left level IIa 2.6 x 2.5 cm lymph node. Right level the 3 2.8 x 2.8 cm nodal metastasis. Smaller though pathologically enlarged lymph nodes at level 3 on the left, level 4 bilaterally.  Mass effect on the inferior aspect of the left parotid gland, major salivary glands are otherwise unremarkable for this nonenhanced examination. Heterogeneous thyroid gland without dominant nodule.  Mild calcific atherosclerosis of the carotid bifurcations. Dense vaso vasorum may reflect anemia.  Remote right clavicle fracture. No destructive bony lesions. Paranasal sinuses are well aerated. Coalescent left mastoid air cells without postobstructive effusion. Soft tissue within the left external auditory canal likely reflects cerumen. Patient is edentulous.  IMPRESSION: Limited noncontrast CT of the neck.  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  Massive left greater than right pathologic cervical lymphadenopathy, largest on the left at 7.8 x 5.8 cm (likely reflecting level 2b/3 nodal conglomeration.  Findings discussed with and reconfirmed by Dr.JON KNAPP on8/15/2015at11:30 pm.   Electronically Signed   By: Elon Alas   On: 11/25/2013 23:38    Assessment/Plan Present on Admission:  . Altered mental status . SEIZURE DISORDER . Hypoglycemia  #1 altered mental status Multifactorial - possibly from seizure from being non-therapeutic on his Dilantin or from hypoglycemia.  patient improving, although not at  baseline Admit to observation Rebolus Dilantin Monitor for hypoglycemia  #2 seizure disorder - rebolus Dilantin and continue home medications  #3 hypoglycemia - encouraged diet, watch for hypoglycemia  #4 hypertension - IV metoprolol given in the emergency department will continue with home medications  Consultants: None  Code Status: full code  Family Communication: girlfriend, sister, daughter   Disposition Plan: return home after her returns to baseline  Time spent: 19 minutes  [place], [place] [facility] Pager [phone]  **Disclaimer: This note may have been dictated with voice recognition software. Similar sounding words can inadvertently be transcribed and this note may contain transcription errors which may not have been corrected upon publication of note.**

## 2013-11-26 NOTE — Progress Notes (Signed)
ANTIBIOTIC CONSULT NOTE - INITIAL  Pharmacy Consult for Vancomycin and Zosyn Indication: rule out sepsis  No Known Allergies  Patient Measurements: Height: 6\' 3"  (190.5 cm) Weight: 113 lb (51.256 kg) IBW/kg (Calculated) : 84.5  Vital Signs: Temp: 92.5 F (33.6 C) (08/16 1156) Temp src: Rectal (08/16 1156) BP: 163/67 mmHg (08/16 1026) Pulse Rate: 48 (08/16 1026) Intake/Output from previous day: 08/15 0701 - 08/16 0700 In: 1495.8 [I.V.:1495.8] Out: -  Intake/Output from this shift:    Labs:  Recent Labs  11/25/13 1715 11/26/13 0617  WBC 9.5 12.5*  HGB 9.7* 10.1*  PLT 124* 140*  CREATININE 4.78* 4.34*   Estimated Creatinine Clearance: 13 ml/min (by C-G formula based on Cr of 4.34). No results found for this basename: VANCOTROUGH, VANCOPEAK, VANCORANDOM, GENTTROUGH, GENTPEAK, GENTRANDOM, TOBRATROUGH, TOBRAPEAK, TOBRARND, AMIKACINPEAK, AMIKACINTROU, AMIKACIN,  in the last 72 hours   Microbiology: No results found for this or any previous visit (from the past 720 hour(s)).  Medical History: Past Medical History  Diagnosis Date  . Diabetes mellitus without complication   . Hypertension   . Chronic anemia   . Stroke   . Seizures   . Cancer   . Renal insufficiency    Vancomycin 8/16>> Zosyn 8/16>>  Assessment: 63yo male with acute encephalopathy and possible sepsis.  Pt has elevated SCr.  Estimated Creatinine Clearance: 13 ml/min (by C-G formula based on Cr of 4.34). Asked to initiate Vancomycin and Zosyn empirically.  Goal of Therapy:  Vancomycin trough level 15-20 mcg/ml Eradicate infection.  Plan:  Vancomycin 1000mg  IV now x 1 dose Zosyn 2.25gm IV q8h (renally adjusted) F/U SCr tomorrow for further Vancomycin dosing Monitor labs, renal fxn, and cultures  [place] Crane, Bela Nyborg A 11/26/2013,1:02 PM

## 2013-11-26 NOTE — Progress Notes (Signed)
Pt's agitation worsening this evening. Walked in to find pt naked and port needle removed by pt. Temp is rising, but very slowly. Pt started at 91.3, placed on warmer at 1100, now temp 95.7. MD is aware. Pt appeared to have pulled entirety of needle out of port. Port has been reaccessed, good blood return noted. Will continue to monitor.

## 2013-11-26 NOTE — Progress Notes (Signed)
[facility] PROGRESS NOTE  Mark Sanford NAT:[id number] DOB: 07-Mar-1951 DOA: 11/25/2013 PCP: Dionisio Paschal, NP  Assessment/Plan: 1. Acute encephalopathy. Likely multifactorial, could be related to hypoglycemia, hypertensive encephalopathy, possible seizure, possible sepsis. He is still very confused and altered. Will continue to follow. CT head negative for acute process on admission. PCO2 on ABG was in normal range. Ammonia level normal. Urinalysis does not show any signs of infection. Chest x-ray does not show any pneumonia. 2. Hypoglycemia. Possibly related to decreased by mouth intake. He is not on any oral hypoglycemics or insulin as an outpatient. Blood sugars have improved since admission. 3. Hypothermia. Etiologies not entirely clear. We'll place on warming blankets. Check blood cultures, a lactic acid, start empiric antibiotics. We'll also check TSH. Could be related to his bradycardia. 4. Sinus bradycardia. Patient did receive intravenous Lopressor earlier this morning. Lopressor has since been discontinued. Possibly physiologic response related to his hypothermia. Check TSH. Continue to monitor on telemetry 5. Hypertension. Use when necessary hydralazine. Will start oral medications once his mental status is improving. Currently he is refusing any oral medications. 6. Possible seizure. The patient is chronically on Dilantin but has not been taking this at home. Dilantin level was undetectable on admission. He did receive a bolus of intravenous Dilantin. We'll continue him on maintenance dosing and check EEG. 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 Patient is followed at [facility]. He recently had a Port-A-Cath placed for chemotherapy. Eventual plans are for surgery. He has not started chemotherapy as of yet. 8. Acute renal failure on chronic kidney disease stage IV. Creatinine has mildly improved since admission with IV fluids. Continue the same.  Code Status:  full code Family Communication: discussed with girlfriend at the bedside Disposition Plan: pending hospital course   Consultants:    Procedures:    Antibiotics:    HPI/Subjective: Patient is confused and unable to provide any history  Objective: Filed Vitals:   11/26/13 1026  BP: 163/67  Pulse: 48  Temp: 93.1 F (33.9 C)  Resp: 20    Intake/Output Summary (Last 24 hours) at 11/26/13 1132 Last data filed at 11/26/13 0631  Gross per 24 hour  Intake 1495.83 ml  Output      0 ml  Net 1495.83 ml   Filed Weights   11/25/13 1640 11/26/13 0145  Weight: 54.432 kg (120 lb) 51.256 kg (113 lb)    Exam:   General:  Patient is awake but lethargic, confused and not able to appropriately answer questions  Cardiovascular: S1, S2 bradycardic  Respiratory: CTA B  Abdomen: soft, nt, nd, bs+  Musculoskeletal: no edema b/l   Data Reviewed: Basic Metabolic Panel:  Recent Labs Lab 11/22/13 1341 11/25/13 1715 11/26/13 0617  NA 137 142 144  K 4.5 4.0 4.2  CL 103 105 110  CO2 21 22 21   GLUCOSE 75 167* 165*  BUN 29* 34* 32*  CREATININE 4.32* 4.78* 4.34*  CALCIUM 8.5 8.7 8.3*   Liver Function Tests:  Recent Labs Lab 11/25/13 1715 11/26/13 0617  AST 11 12  ALT 5 <5  ALKPHOS 97 103  BILITOT 0.2* 0.2*  PROT 5.9* 6.1  ALBUMIN 1.6* 1.7*   No results found for this basename: LIPASE, AMYLASE,  in the last 168 hours  Recent Labs Lab 11/25/13 1717  AMMONIA 23   CBC:  Recent Labs Lab 11/22/13 1341 11/25/13 1715 11/26/13 0617  WBC 11.4* 9.5 12.5*  HGB 10.5* 9.7* 10.1*  HCT 31.0* 29.0*  30.7*  MCV 89.3 92.1 91.9  PLT 128* 124* 140*   Cardiac Enzymes: No results found for this basename: CKTOTAL, CKMB, CKMBINDEX, TROPONINI,  in the last 168 hours BNP (last 3 results) No results found for this basename: PROBNP,  in the last 8760 hours CBG:  Recent Labs Lab 11/25/13 1654 11/25/13 2016 11/26/13 0428 11/26/13 0819 11/26/13 1046  GLUCAP 194* 101*  94 150* 171*    No results found for this or any previous visit (from the past 240 hour(s)).   Studies: Dg Chest 1 View  11/25/2013   CLINICAL DATA:  Altered mental status  EXAM: CHEST - 1 VIEW  COMPARISON:  09/13/2013  FINDINGS: Right-sided Port-A-Cath terminates at the mid to low SVC.  Midline trachea. Borderline cardiomegaly. No pleural effusion or pneumothorax. No congestive failure.  IMPRESSION: Borderline cardiomegaly, without acute disease.   Electronically Signed   By: Abigail Miyamoto M.D.   On: 11/25/2013 17:58   Ct Head Wo Contrast  11/25/2013   CLINICAL DATA:  Altered mental status. 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  EXAM: CT HEAD WITHOUT CONTRAST  TECHNIQUE: Contiguous axial images were obtained from the base of the skull through the vertex without intravenous contrast.  COMPARISON:  None.  FINDINGS: Large area of encephalomalacia from prior right MCA infarct. No acute intracranial abnormality. Specifically, no hemorrhage, hydrocephalus, mass lesion, acute infarction, or significant intracranial injury. No acute calvarial abnormality. Visualized paranasal sinuses and mastoids clear. Orbital soft tissues unremarkable.  Soft tissue mass partially imaged in the left upper lateral neck, presumably known soft tissue metastasis from patient's primary soft tissue cancer.  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 No acute intracranial abnormality.  Large upper left lateral neck mass, presumed known metastasis.   Electronically Signed   By: Rolm Baptise M.D.   On: 11/25/2013 18:12   Ct Soft Tissue Neck Wo Contrast  11/25/2013   CLINICAL DATA:  Left neck swelling. History of cancer. End-stage renal disease.  EXAM: CT NECK WITHOUT CONTRAST  TECHNIQUE: Multidetector CT imaging of the neck was performed following the standard protocol without intravenous contrast.  COMPARISON:  CT of the head November 25, 2013 at 1810 hr.  FINDINGS: Limited noncontrast CT of the neck.  At  least 4.2 x 3 cm mass within the oropharynx, this appears to arise from the left palatine tonsil, though there is abnormal fullness of the left nasopharyngeal soft tissues/ torus tubarius. Preservation the left parapharyngeal fat tissue planes. Mass effaces the oropharynx. No extension to the hypopharynx. The larynx is unremarkable.  Massive left greater than right cervical lymphadenopathy, including a 7.8 x 5.8 cm necrotic mass inseparable from the superior aspect of the sternocleidomastoid muscle, extending into the carotid space with additional probable left level IIa 2.6 x 2.5 cm lymph node. Right level the 3 2.8 x 2.8 cm nodal metastasis. Smaller though pathologically enlarged lymph nodes at level 3 on the left, level 4 bilaterally.  Mass effect on the inferior aspect of the left parotid gland, major salivary glands are otherwise unremarkable for this nonenhanced examination. Heterogeneous thyroid gland without dominant nodule.  Mild calcific atherosclerosis of the carotid bifurcations. Dense vaso vasorum may reflect anemia.  Remote right clavicle fracture. No destructive bony lesions. Paranasal sinuses are well aerated. Coalescent left mastoid air cells without postobstructive effusion. Soft tissue within the left external auditory canal likely reflects cerumen. Patient is edentulous.  IMPRESSION: Limited noncontrast CT of the neck.  4.2 x 3 cm mass (likely primary  squamous cell carcinoma) within the oropharynx, effacing the airway, this appears to arise from the soft palate versus left palatine tonsil, there is fullness of the left nasopharyngeal soft tissues though, this does not appear to represent a nasopharyngeal carcinoma, no postobstructive mastoid effusion.  Massive left greater than right pathologic cervical lymphadenopathy, largest on the left at 7.8 x 5.8 cm (likely reflecting level 2b/3 nodal conglomeration.  Findings discussed with and reconfirmed by Dr.JON KNAPP on8/15/2015at11:30 pm.    Electronically Signed   By: Elon Alas   On: 11/25/2013 23:38    Scheduled Meds: . amLODipine  10 mg Oral q morning - 10a  . enoxaparin (LOVENOX) injection  30 mg Subcutaneous Q24H  . fluticasone  2 spray Each Nare QHS  . phenytoin  400 mg Oral QHS  . simvastatin  10 mg Oral QHS  . sodium chloride  3 mL Intravenous Q12H   Continuous Infusions: . sodium chloride 1,000 mL (11/26/13 0451)    Active Problems:   SEIZURE DISORDER   Hypoglycemia   Altered mental status   Encephalopathy acute    Time spent: 5mins    MEMON,JEHANZEB  [facility] Pager [phone]. If 7PM-7AM, please contact night-coverage at [email or web address], password [facility] 11/26/2013, 11:32 AM  LOS: 1 day

## 2013-11-26 NOTE — Progress Notes (Signed)
Patient arrived to the floor around 0145. MD notified about elevated BP of 205/89, instructed to give PO dose of Lopressor 25mg . Also, held PO Dilantin and gave IVPB dose.

## 2013-11-26 NOTE — Progress Notes (Addendum)
When checking pt's VS this AM, his temp was low at 93.1 rectally. MD notified and bear hugger now being used to increase pt's temp. Will continue to monitor.

## 2013-11-26 NOTE — ED Notes (Signed)
Patient family turned call light on to state that family member needed to go to restroom. Patient was standing beside bed and had urinated on self. Took patient to restroom and let him finish using the toilet. Washed patient up and changed into dry gown.

## 2013-11-27 ENCOUNTER — Inpatient Hospital Stay (HOSPITAL_COMMUNITY): Payer: Medicaid Other

## 2013-11-27 ENCOUNTER — Inpatient Hospital Stay (HOSPITAL_COMMUNITY)
Admit: 2013-11-27 | Discharge: 2013-11-27 | Disposition: A | Discharge status: Home / Self Care | Payer: Medicaid Other | Attending: Internal Medicine | Admitting: Internal Medicine

## 2013-11-27 ENCOUNTER — Ambulatory Visit (HOSPITAL_COMMUNITY): Payer: Medicaid Other

## 2013-11-27 DIAGNOSIS — N179 Acute kidney failure, unspecified: Secondary | ICD-10-CM

## 2013-11-27 DIAGNOSIS — G9340 Encephalopathy, unspecified: Secondary | ICD-10-CM

## 2013-11-27 DIAGNOSIS — Z5189 Encounter for other specified aftercare: Secondary | ICD-10-CM

## 2013-11-27 DIAGNOSIS — E162 Hypoglycemia, unspecified: Secondary | ICD-10-CM

## 2013-11-27 DIAGNOSIS — R569 Unspecified convulsions: Secondary | ICD-10-CM

## 2013-11-27 DIAGNOSIS — N189 Chronic kidney disease, unspecified: Secondary | ICD-10-CM

## 2013-11-27 LAB — GLUCOSE, CAPILLARY
GLUCOSE-CAPILLARY: 86 mg/dL (ref 70–99)
GLUCOSE-CAPILLARY: 83 mg/dL (ref 70–99)
GLUCOSE-CAPILLARY: 73 mg/dL (ref 70–99)
GLUCOSE-CAPILLARY: 126 mg/dL — AB (ref 70–99)
Glucose-Capillary: 112 mg/dL — ABNORMAL HIGH (ref 70–99)
Glucose-Capillary: 95 mg/dL (ref 70–99)
Glucose-Capillary: 89 mg/dL (ref 70–99)

## 2013-11-27 LAB — CORTISOL: Cortisol, Plasma: 16 ug/dL

## 2013-11-27 LAB — CBC
HEMATOCRIT: 26.1 % — AB (ref 39.0–52.0)
Hemoglobin: 8.6 g/dL — ABNORMAL LOW (ref 13.0–17.0)
MCH: 30.5 pg (ref 26.0–34.0)
MCHC: 33 g/dL (ref 30.0–36.0)
MCV: 92.6 fL (ref 78.0–100.0)
PLATELETS: 108 10*3/uL — AB (ref 150–400)
RBC: 2.82 MIL/uL — AB (ref 4.22–5.81)
RDW: 16.9 % — ABNORMAL HIGH (ref 11.5–15.5)
WBC: 9.2 10*3/uL (ref 4.0–10.5)

## 2013-11-27 LAB — BASIC METABOLIC PANEL
ANION GAP: 12 (ref 5–15)
BUN: 33 mg/dL — AB (ref 6–23)
CHLORIDE: 114 meq/L — AB (ref 96–112)
CO2: 22 meq/L (ref 19–32)
CREATININE: 4.54 mg/dL — AB (ref 0.50–1.35)
Calcium: 8 mg/dL — ABNORMAL LOW (ref 8.4–10.5)
GFR calc non Af Amer: 13 mL/min — ABNORMAL LOW (ref >90)
GFR, EST AFRICAN AMERICAN: 15 mL/min — AB (ref >90)
Glucose, Bld: 94 mg/dL (ref 70–99)
Potassium: 4.2 mEq/L (ref 3.7–5.3)
Sodium: 148 mEq/L — ABNORMAL HIGH (ref 137–147)

## 2013-11-27 MED ORDER — SODIUM CHLORIDE 0.9 % IV SOLN: 700.0000 mg | Freq: Once | INTRAVENOUS | Status: DC

## 2013-11-27 MED ORDER — SODIUM CHLORIDE 0.9 % IV SOLN
500.0000 mg | Freq: Once | INTRAVENOUS | Status: AC
  Administered 2013-11-27: 500 mg via INTRAVENOUS
  Filled 2013-11-27: qty 10

## 2013-11-27 MED ORDER — DEXTROSE-NACL 5-0.45 % IV SOLN: INTRAVENOUS | Status: DC

## 2013-11-27 MED ORDER — DEXTROSE-NACL 5-0.45 % IV SOLN
INTRAVENOUS | Status: DC
  Administered 2013-11-27 – 2013-11-28 (×3): via INTRAVENOUS

## 2013-11-27 MED ORDER — PHENYTOIN SODIUM 50 MG/ML IJ SOLN
INTRAMUSCULAR | Status: AC
  Filled 2013-11-27: qty 10

## 2013-11-27 MED ORDER — VANCOMYCIN HCL 500 MG IV SOLR
500.0000 mg | INTRAVENOUS | Status: DC
  Administered 2013-11-28: 500 mg via INTRAVENOUS
  Filled 2013-11-27: qty 500

## 2013-11-27 NOTE — Care Management Note (Addendum)
    Page 1 of 1   11/28/2013     1:29:30 PM CARE MANAGEMENT NOTE 11/28/2013  Patient:  DEAVEON, SCHOEN   Account Number:  [id number]  Date Initiated:  11/27/2013  Documentation initiated by:  Theophilus Kinds  Subjective/Objective Assessment:   Pt admitted from home with altered mental status. Pt has been living with his girlfriend but pts daughter states that pt cannot return home at discharge and is requesting placement.     Action/Plan:   CSW is aware. Pt may also be starting dialysis while in hospital. Will continue to follow for discharge planning needs.   Anticipated DC Date:  12/01/2013   Anticipated DC Plan:  SKILLED NURSING FACILITY  In-house referral  Clinical Social Worker      DC Planning Services  CM consult      Choice offered to / List presented to:             Status of service:  Completed, signed off Medicare Important Message given?   (If response is "NO", the following Medicare IM given date fields will be blank) Date Medicare IM given:   Medicare IM given by:   Date Additional Medicare IM given:   Additional Medicare IM given by:    Discharge Disposition:  ACUTE TO ACUTE TRANS  Per UR Regulation:    If discussed at Long Length of Stay Meetings, dates discussed:    Comments:  11/28/13 [place], RN BSN CM Pt to be transferred to [facility] when bed available.  11/27/13 [place], RN BSN CM

## 2013-11-27 NOTE — Consult Note (Signed)
[place] A. Merlene Laughter, MD     [email or web address]          Mark Sanford is an 63 y.o. male.   ASSESSMENT/PLAN: 1. Altered mental status/Acute encephalopathy. The flavor seems most consistent with toxic metabolic etiologies particularly from hypoglycemia and acute on chronic renal failure. However, the patient seemed to be hypothermic which raises suspicion of sepsis. No underlying cause of sepsis is being uncovered. The patient also has subtherapeutic Dilantin which also raises the suspicion of possible seizures. The patient will be given a mini loading dose of Dilantin. A level be checked the morning. The patient's cognition/level of arousal is not improved, we may consider additional testing including a spinal tap.    2. 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 This likely serve as a substrate for the patient's seizure and therefore he has a high risk of recurrent seizures. He should remain on antiplatelet medications.  The patient is 63 year old black male who has recently been diagnosed with a stage IV adenocarcinoma Of the throat. The patient had a procedure on last Wednesday so that he can receive the chemotherapy. It appears that he has been in bed most the time that the procedure. It appears also that he has not been eating well. He does have a history of diabetes. The patient was noted to be dyspneic and unresponsive. When EMS arrived, his blood glucose level was 40. He has had other spells of hypoglycemia in the past as late as 2014. He responded rapidly to medications including glucose at that time. The family seems concerned tonight because he was more alert and with it yesterday but they believe that he is more unresponsive today. The patient has a long-standing history of seizures although his seizures have been controlled. He has not had seizures in many years. Apparently when he was unresponsive in 2014 due to the hypoglycemic episode, the indicated that his physicians were  considering weaning him off the Dilantin because he has been well controlled. Further history is unobtainable because of the confusion/level of consciousness.  GENERAL: He is in no acute distress. He appears thin, malnourished and 47-57 years older than the stated age.  HEENT: Supple. Atraumatic normocephalic.   ABDOMEN: soft  EXTREMITIES: No edema   BACK: Normal.  SKIN: Normal by inspection.    MENTAL STATUS: Patient lays in bed with eyes closed. Opens his eyes to sternal rub/painful stimuli. He moves both extremities but does not follow commands. He does focuses At times but closes his eyes rapidly without stimulation.  CRANIAL NERVES: Pupils are equal, round and reactive to light; extra ocular movements are full- With passive range of motion/vestibular ocular reflexes, upper and lower facial muscles are normal in strength and symmetric, there is no flattening of the nasolabial folds; Corneal reflexes are intact.  MOTOR: Bulk and tone seems normal and he has antigravity strength in the upper extremities to deep painful stim light.  COORDINATION: No tremor; no dysmetria observed.  REFLEXES: Deep tendon reflexes are symmetrical and normal.  SENSATION: Responsive to deep painful stimuli bilaterally.    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 No acute intracranial  abnormality.  Large upper left lateral neck mass, presumed known metastasis.     Past Medical History  Diagnosis Date  . Diabetes mellitus without complication   . Hypertension   . Chronic anemia   . Stroke   . Seizures   . Cancer   . Renal insufficiency     Past Surgical History  Procedure Laterality Date  . Bascilic vein transposition Left 08/09/2013    Procedure: LEFT 1ST STAGE BRACHIAL VEIN TRANSPOSITION;  Surgeon: Conrad Denton, MD;  Location: [facility] OR;  Service: Vascular;  Laterality: Left;    Family History  Problem Relation Age of Onset  . Diabetes Mother   . Hypertension Mother     . Diabetes Father   . Hypertension Father     Social History:  reports that he quit smoking about 9 months ago. He has never used smokeless tobacco. He reports that he does not drink alcohol or use illicit drugs.  Allergies: No Known Allergies  Medications: Prior to Admission medications   Medication Sig Start Date End Date Taking? Authorizing Provider  amLODipine (NORVASC) 10 MG tablet Take 10 mg by mouth every morning.    Yes Historical Provider, MD  phenytoin (DILANTIN) 100 MG ER capsule Take 200 mg by mouth at bedtime.    Yes Historical Provider, MD    Scheduled Meds: . amLODipine  10 mg Oral q morning - 10a  . enoxaparin (LOVENOX) injection  30 mg Subcutaneous Q24H  . fluticasone  2 spray Each Nare QHS  . phenytoin (DILANTIN) IV  100 mg Intravenous 3 times per day  . piperacillin-tazobactam (ZOSYN)  IV  2.25 g Intravenous 3 times per day  . simvastatin  10 mg Oral QHS  . sodium chloride  3 mL Intravenous Q12H  . [START ON 11/28/2013] vancomycin  500 mg Intravenous Q48H   Continuous Infusions: . dextrose 5 % and 0.45% NaCl 100 mL/hr at 11/27/13 1117   PRN Meds:.acetaminophen, acetaminophen, alum & mag hydroxide-simeth, docusate sodium, hydrALAZINE, LORazepam, oxyCODONE, prochlorperazine   Blood pressure 167/52, pulse 78, temperature 98.1 F (36.7 C), temperature source Oral, resp. rate 19, height 6' 3"  (1.905 m), weight 51.256 kg (113 lb), SpO2 98.00%.   Results for orders placed during the hospital encounter of 11/25/13 (from the past 48 hour(s))  CBG MONITORING, ED     Status: Abnormal   Collection Time    11/25/13  8:16 PM      Result Value Ref Range   Glucose-Capillary 101 (*) 70 - 99 mg/dL  URINALYSIS, ROUTINE W REFLEX MICROSCOPIC     Status: Abnormal   Collection Time    11/25/13  9:26 PM      Result Value Ref Range   Color, Urine YELLOW  YELLOW   APPearance CLEAR  CLEAR   Specific Gravity, Urine 1.020  1.005 - 1.030   pH 6.0  5.0 - 8.0   Glucose, UA 100  (*) NEGATIVE mg/dL   Hgb urine dipstick MODERATE (*) NEGATIVE   Bilirubin Urine NEGATIVE  NEGATIVE   Ketones, ur TRACE (*) NEGATIVE mg/dL   Protein, ur >300 (*) NEGATIVE mg/dL   Urobilinogen, UA 0.2  0.0 - 1.0 mg/dL   Nitrite NEGATIVE  NEGATIVE   Leukocytes, UA NEGATIVE  NEGATIVE  URINE RAPID DRUG SCREEN (HOSP PERFORMED)     Status: None   Collection Time    11/25/13  9:26 PM      Result Value Ref Range   Opiates NONE DETECTED  NONE DETECTED   Cocaine NONE DETECTED  NONE DETECTED   Benzodiazepines NONE DETECTED  NONE DETECTED   Amphetamines NONE DETECTED  NONE DETECTED   Tetrahydrocannabinol NONE DETECTED  NONE DETECTED   Barbiturates NONE DETECTED  NONE DETECTED   Comment:            DRUG SCREEN FOR MEDICAL PURPOSES  ONLY.  IF CONFIRMATION IS NEEDED     FOR ANY PURPOSE, NOTIFY LAB     WITHIN 5 DAYS.                LOWEST DETECTABLE LIMITS     FOR URINE DRUG SCREEN     Drug Class       Cutoff (ng/mL)     Amphetamine      1000     Barbiturate      200     Benzodiazepine   130     Tricyclics       865     Opiates          300     Cocaine          300     THC              50  URINE MICROSCOPIC-ADD ON     Status: Abnormal   Collection Time    11/25/13  9:26 PM      Result Value Ref Range   Squamous Epithelial / LPF FEW (*) RARE   WBC, UA 0-2  <3 WBC/hpf   RBC / HPF 3-6  <3 RBC/hpf   Bacteria, UA FEW (*) RARE  BLOOD GAS, ARTERIAL     Status: Abnormal   Collection Time    11/25/13 11:01 PM      Result Value Ref Range   FIO2 21.00     Delivery systems ROOM AIR     pH, Arterial 7.348 (*) 7.350 - 7.450   pCO2 arterial 43.9  35.0 - 45.0 mmHg   pO2, Arterial 95.0  80.0 - 100.0 mmHg   Bicarbonate 23.5  20.0 - 24.0 mEq/L   TCO2 22.0  0 - 100 mmol/L   Acid-base deficit 1.3  0.0 - 2.0 mmol/L   O2 Saturation 97.2     Patient temperature 37.0     Collection site RIGHT RADIAL     Drawn by [id number]     Sample type ARTERIAL     Allens test (pass/fail) PASS  PASS  GLUCOSE,  CAPILLARY     Status: None   Collection Time    11/26/13  4:28 AM      Result Value Ref Range   Glucose-Capillary 94  70 - 99 mg/dL  CBC     Status: Abnormal   Collection Time    11/26/13  6:17 AM      Result Value Ref Range   WBC 12.5 (*) 4.0 - 10.5 K/uL   RBC 3.34 (*) 4.22 - 5.81 MIL/uL   Hemoglobin 10.1 (*) 13.0 - 17.0 g/dL   HCT 30.7 (*) 39.0 - 52.0 %   MCV 91.9  78.0 - 100.0 fL   MCH 30.2  26.0 - 34.0 pg   MCHC 32.9  30.0 - 36.0 g/dL   RDW 16.8 (*) 11.5 - 15.5 %   Platelets 140 (*) 150 - 400 K/uL  COMPREHENSIVE METABOLIC PANEL     Status: Abnormal   Collection Time    11/26/13  6:17 AM      Result Value Ref Range   Sodium 144  137 - 147 mEq/L   Potassium 4.2  3.7 - 5.3 mEq/L   Chloride 110  96 - 112 mEq/L   CO2 21  19 - 32 mEq/L   Glucose, Bld 165 (*) 70 - 99 mg/dL   BUN 32 (*) 6 - 23 mg/dL   Creatinine, Ser 4.34 (*) 0.50 -  1.35 mg/dL   Calcium 8.3 (*) 8.4 - 10.5 mg/dL   Total Protein 6.1  6.0 - 8.3 g/dL   Albumin 1.7 (*) 3.5 - 5.2 g/dL   AST 12  0 - 37 U/L   ALT <5  0 - 53 U/L   Comment: REPEATED TO VERIFY   Alkaline Phosphatase 103  39 - 117 U/L   Total Bilirubin 0.2 (*) 0.3 - 1.2 mg/dL   GFR calc non Af Amer 13 (*) >90 mL/min   GFR calc Af Amer 16 (*) >90 mL/min   Comment: (NOTE)     The eGFR has been calculated using the CKD EPI equation.     This calculation has not been validated in all clinical situations.     eGFR's persistently <90 mL/min signify possible Chronic Kidney     Disease.   Anion gap 13  5 - 15  GLUCOSE, CAPILLARY     Status: Abnormal   Collection Time    11/26/13  8:19 AM      Result Value Ref Range   Glucose-Capillary 150 (*) 70 - 99 mg/dL   Comment 1 Notify RN    GLUCOSE, CAPILLARY     Status: Abnormal   Collection Time    11/26/13 10:46 AM      Result Value Ref Range   Glucose-Capillary 171 (*) 70 - 99 mg/dL   Comment 1 Notify RN    LACTIC ACID, PLASMA     Status: None   Collection Time    11/26/13 12:33 PM      Result Value Ref  Range   Lactic Acid, Venous 1.2  0.5 - 2.2 mmol/L  CULTURE, BLOOD (ROUTINE X 2)     Status: None   Collection Time    11/26/13 12:33 PM      Result Value Ref Range   Specimen Description PORTA CATH     Special Requests BOTTLES DRAWN AEROBIC AND ANAEROBIC 8CC     Culture NO GROWTH 1 DAY     Report Status PENDING    CULTURE, BLOOD (ROUTINE X 2)     Status: None   Collection Time    11/26/13 12:39 PM      Result Value Ref Range   Specimen Description BLOOD RIGHT HAND     Special Requests BOTTLES DRAWN AEROBIC ONLY 6CC     Culture NO GROWTH 1 DAY     Report Status PENDING    GLUCOSE, CAPILLARY     Status: Abnormal   Collection Time    11/26/13  4:37 PM      Result Value Ref Range   Glucose-Capillary 163 (*) 70 - 99 mg/dL   Comment 1 Notify RN     Comment 2 Documented in Chart    GLUCOSE, CAPILLARY     Status: Abnormal   Collection Time    11/26/13  9:07 PM      Result Value Ref Range   Glucose-Capillary 121 (*) 70 - 99 mg/dL   Comment 1 Notify RN     Comment 2 Documented in Chart    GLUCOSE, CAPILLARY     Status: Abnormal   Collection Time    11/27/13 12:03 AM      Result Value Ref Range   Glucose-Capillary 112 (*) 70 - 99 mg/dL  GLUCOSE, CAPILLARY     Status: None   Collection Time    11/27/13  3:39 AM      Result Value Ref Range   Glucose-Capillary 95  70 - 99 mg/dL  CBC     Status: Abnormal   Collection Time    11/27/13  6:02 AM      Result Value Ref Range   WBC 9.2  4.0 - 10.5 K/uL   RBC 2.82 (*) 4.22 - 5.81 MIL/uL   Hemoglobin 8.6 (*) 13.0 - 17.0 g/dL   HCT 26.1 (*) 39.0 - 52.0 %   MCV 92.6  78.0 - 100.0 fL   MCH 30.5  26.0 - 34.0 pg   MCHC 33.0  30.0 - 36.0 g/dL   RDW 16.9 (*) 11.5 - 15.5 %   Platelets 108 (*) 150 - 400 K/uL   Comment: SPECIMEN CHECKED FOR CLOTS     PLATELET COUNT CONFIRMED BY SMEAR  BASIC METABOLIC PANEL     Status: Abnormal   Collection Time    11/27/13  6:02 AM      Result Value Ref Range   Sodium 148 (*) 137 - 147 mEq/L   Potassium  4.2  3.7 - 5.3 mEq/L   Chloride 114 (*) 96 - 112 mEq/L   CO2 22  19 - 32 mEq/L   Glucose, Bld 94  70 - 99 mg/dL   BUN 33 (*) 6 - 23 mg/dL   Creatinine, Ser 4.54 (*) 0.50 - 1.35 mg/dL   Calcium 8.0 (*) 8.4 - 10.5 mg/dL   GFR calc non Af Amer 13 (*) >90 mL/min   GFR calc Af Amer 15 (*) >90 mL/min   Comment: (NOTE)     The eGFR has been calculated using the CKD EPI equation.     This calculation has not been validated in all clinical situations.     eGFR's persistently <90 mL/min signify possible Chronic Kidney     Disease.   Anion gap 12  5 - 15  CORTISOL     Status: None   Collection Time    11/27/13  6:02 AM      Result Value Ref Range   Cortisol, Plasma 16.0     Comment: (NOTE)     AM:  4.3 - 22.4 ug/dL     PM:  3.1 - 16.7 ug/dL     Performed at [facility], CAPILLARY     Status: None   Collection Time    11/27/13  6:58 AM      Result Value Ref Range   Glucose-Capillary 86  70 - 99 mg/dL  GLUCOSE, CAPILLARY     Status: None   Collection Time    11/27/13  7:55 AM      Result Value Ref Range   Glucose-Capillary 89  70 - 99 mg/dL   Comment 1 Notify RN    GLUCOSE, CAPILLARY     Status: None   Collection Time    11/27/13 11:47 AM      Result Value Ref Range   Glucose-Capillary 83  70 - 99 mg/dL   Comment 1 Notify RN    GLUCOSE, CAPILLARY     Status: None   Collection Time    11/27/13  4:51 PM      Result Value Ref Range   Glucose-Capillary 73  70 - 99 mg/dL   Comment 1 Notify RN      Ct Soft Tissue Neck Wo Contrast  11/25/2013   CLINICAL DATA:  Left neck swelling. History of cancer. End-stage renal disease.  EXAM: CT NECK WITHOUT CONTRAST  TECHNIQUE: Multidetector CT imaging of the neck was performed following the standard  protocol without intravenous contrast.  COMPARISON:  CT of the head November 25, 2013 at 1810 hr.  FINDINGS: Limited noncontrast CT of the neck.  At least 4.2 x 3 cm mass within the oropharynx, this appears to arise from the left  palatine tonsil, though there is abnormal fullness of the left nasopharyngeal soft tissues/ torus tubarius. Preservation the left parapharyngeal fat tissue planes. Mass effaces the oropharynx. No extension to the hypopharynx. The larynx is unremarkable.  Massive left greater than right cervical lymphadenopathy, including a 7.8 x 5.8 cm necrotic mass inseparable from the superior aspect of the sternocleidomastoid muscle, extending into the carotid space with additional probable left level IIa 2.6 x 2.5 cm lymph node. Right level the 3 2.8 x 2.8 cm nodal metastasis. Smaller though pathologically enlarged lymph nodes at level 3 on the left, level 4 bilaterally.  Mass effect on the inferior aspect of the left parotid gland, major salivary glands are otherwise unremarkable for this nonenhanced examination. Heterogeneous thyroid gland without dominant nodule.  Mild calcific atherosclerosis of the carotid bifurcations. Dense vaso vasorum may reflect anemia.  Remote right clavicle fracture. No destructive bony lesions. Paranasal sinuses are well aerated. Coalescent left mastoid air cells without postobstructive effusion. Soft tissue within the left external auditory canal likely reflects cerumen. Patient is edentulous.  IMPRESSION: Limited noncontrast CT of the neck.  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  Massive left greater than right pathologic cervical lymphadenopathy, largest on the left at 7.8 x 5.8 cm (likely reflecting level 2b/3 nodal conglomeration.  Findings discussed with and reconfirmed by Dr.JON KNAPP on8/15/2015at11:30 pm.   Electronically Signed   By: Elon Alas   On: 11/25/2013 23:38        Cynthia Cogle A.  Merlene Laughter, M.D.  Diplomate, Tax adviser of Psychiatry and Neurology ( Neurology). 11/27/2013, 7:19 PM

## 2013-11-27 NOTE — Progress Notes (Signed)
INITIAL NUTRITION ASSESSMENT  DOCUMENTATION CODES Per approved criteria  -Severe malnutrition in the context of chronic illness   INTERVENTION: Magic cup TID with meals, each supplement provides 290 kcal and 9 grams of protein  NUTRITION DIAGNOSIS: Inadequate oral intake related to altered mental status as evidenced by pt hx of decreased po intake for 1 week prior to admission.  Goal: Pt to meet >/= 90% of their estimated nutrition needs    Monitor: Po intake, labs and wt trends    Reason for Assessment: Malnutrition Screen Score =  2  63 y.o. male   ASSESSMENT:  Pt has Stage IV throat adenocarcinoma and getting prepared to start chemotherapy. He is s/p fistula placement (08/09/13) due to chronic kidney disease (stage IV). He presents with acute encephalopathy and altered mental status. Pt not responding to questions but daughter is present. He stopped eating last Monday per daughter. She is trying to get him to eat during my visit but pt is refusing.  His weight was 158# on April 28,2014 then had decreased to 130# on November 23, 2012. Pt admitted on 10/17/13 following fall and c/o increased weakness weight 124# per hospital records.  Partial nutrition focused exam due to pt unable to participate at this time. Muscle wasting to clavicles and temporal regions, and orbital subcutaneous fat loss.  Pt meets criteria for severe MALNUTRITION in the context of chronic illness as evidenced by severe weight loss 10% in 30 days and </=75% for >/= 1 month.   Height: Ht Readings from Last 1 Encounters:  11/26/13 6\' 3"  (1.905 m)    Weight: Wt Readings from Last 1 Encounters:  11/26/13 113 lb (51.256 kg)    Wt Readings from Last 10 Encounters:  11/26/13 113 lb (51.256 kg)  11/16/13 111 lb 12.8 oz (50.712 kg)  10/17/13 126 lb 9.6 oz (57.425 kg)  09/15/13 132 lb (59.875 kg)  09/13/13 130 lb (58.968 kg)  09/13/13 128 lb (58.06 kg)  08/07/13 131 lb (59.421 kg)  08/07/13 131 lb (59.421 kg)   08/03/13 134 lb (60.782 kg)  06/28/13 130 lb (58.968 kg)    Usual Body Weight: 130#  % Usual Body Weight: 87%  BMI:  Body mass index is 14.12 kg/(m^2). underweight  Estimated Nutritional Needs: Kcal: 1800-2100 Protein: >50 gr Fluid: >1800 ml daily   Skin: intact  Diet Order: General (po 10%)  EDUCATION NEEDS: -Education not appropriate at this time   Intake/Output Summary (Last 24 hours) at 11/27/13 1709 Last data filed at 11/27/13 1312  Gross per 24 hour  Intake      0 ml  Output      0 ml  Net      0 ml    Last BM: PTA  Labs:   Recent Labs Lab 11/25/13 1715 11/26/13 0617 11/27/13 0602  NA 142 144 148*  K 4.0 4.2 4.2  CL 105 110 114*  CO2 22 21 22   BUN 34* 32* 33*  CREATININE 4.78* 4.34* 4.54*  CALCIUM 8.7 8.3* 8.0*  GLUCOSE 167* 165* 94    CBG (last 3)   Recent Labs  11/27/13 0755 11/27/13 1147 11/27/13 1651  GLUCAP 89 83 73    Scheduled Meds: . amLODipine  10 mg Oral q morning - 10a  . enoxaparin (LOVENOX) injection  30 mg Subcutaneous Q24H  . fluticasone  2 spray Each Nare QHS  . phenytoin (DILANTIN) IV  100 mg Intravenous 3 times per day  . piperacillin-tazobactam (ZOSYN)  IV  2.25  g Intravenous 3 times per day  . simvastatin  10 mg Oral QHS  . sodium chloride  3 mL Intravenous Q12H  . [START ON 11/28/2013] vancomycin  500 mg Intravenous Q48H    Continuous Infusions: . dextrose 5 % and 0.45% NaCl 100 mL/hr at 11/27/13 1117    Past Medical History  Diagnosis Date  . Diabetes mellitus without complication   . Hypertension   . Chronic anemia   . Stroke   . Seizures   . Cancer   . Renal insufficiency     Past Surgical History  Procedure Laterality Date  . Bascilic vein transposition Left 08/09/2013    Procedure: LEFT 1ST STAGE BRACHIAL VEIN TRANSPOSITION;  Surgeon: Conrad [place], MD;  Location: [facility];  Service: Vascular;  Laterality: Left;    Colman Cater MS,RD,CSG,LDN Office: [phone] Pager: [phone]

## 2013-11-27 NOTE — Plan of Care (Signed)
Confirmed with doctor that pt prepping for dialysis, but has not received first procedure yet.

## 2013-11-27 NOTE — Progress Notes (Signed)
ANTIBIOTIC CONSULT NOTE - follow up  Pharmacy Consult for Vancomycin and Zosyn Indication: rule out sepsis  No Known Allergies  Patient Measurements: Height: 6\' 3"  (190.5 cm) Weight: 113 lb (51.256 kg) IBW/kg (Calculated) : 84.5  Vital Signs: Temp: 98 F (36.7 C) (08/17 0500) Temp src: Rectal (08/17 0500) BP: 159/62 mmHg (08/17 0500) Pulse Rate: 75 (08/17 0500) Intake/Output from previous day: 08/16 0701 - 08/17 0700 In: 685 [I.V.:435; IV Piggyback:250] Out: -  Intake/Output from this shift:    Labs:  Recent Labs  11/25/13 1715 11/26/13 0617 11/27/13 0602  WBC 9.5 12.5* 9.2  HGB 9.7* 10.1* 8.6*  PLT 124* 140* 108*  CREATININE 4.78* 4.34* 4.54*   Estimated Creatinine Clearance: 12.4 ml/min (by C-G formula based on Cr of 4.54). No results found for this basename: VANCOTROUGH, VANCOPEAK, VANCORANDOM, GENTTROUGH, GENTPEAK, GENTRANDOM, TOBRATROUGH, TOBRAPEAK, TOBRARND, AMIKACINPEAK, AMIKACINTROU, AMIKACIN,  in the last 72 hours   Microbiology: No results found for this or any previous visit (from the past 720 hour(s)).  Medical History: Past Medical History  Diagnosis Date  . Diabetes mellitus without complication   . Hypertension   . Chronic anemia   . Stroke   . Seizures   . Cancer   . Renal insufficiency    Vancomycin 8/16>> Zosyn 8/16>>  Assessment: 63yo male with acute encephalopathy and possible sepsis.  Pt has elevated SCr and ARF on CKD.  Estimated Creatinine Clearance: 12.4 ml/min (by C-G formula based on Cr of 4.54). Asked to initiate Vancomycin and Zosyn empirically.  Pt is currently afebrile with normal WBC.    Goal of Therapy:  Vancomycin trough level 15-20 mcg/ml Eradicate infection.  Plan:  Vancomycin 1000mg  IV on admission x 1 dose then Vancomycin 500mg  IV q48hours Zosyn 2.25gm IV q8h  Vancomycin trough level at steady state Monitor labs, renal fxn, and cultures Deescalate antibiotics when improved / appropriate  Hart Robinsons  A 11/27/2013,7:49 AM

## 2013-11-27 NOTE — Progress Notes (Signed)
Dr Merlene Laughter saw pt tonight. Family upset regarding dr not following up with them after examination. Nurse called physician and let daughter speak with him about pt status. After phone call, family pleased. Family denies anymore questions or concerns at this time. Daughter requests sitter. Notified AC. Will have sitter in with pt until 0300. Family is aware of this and is happy with it. Right now, pt stable; alert at times and lethargic at times. Temp 98.6 with warming blanket in place and rectal probe in place. Oxygen saturation 100% on 2L oxygen and continuous pulse oximetry in place. Will monitor pt frequently throughout night. Lots of family in room with pt at this time.

## 2013-11-27 NOTE — Progress Notes (Signed)
[facility] PROGRESS NOTE  Mark Sanford WJX:[id number] DOB: [birth year] DOA: 11/25/2013 PCP: Dionisio Paschal, NP  Assessment/Plan: 1. Acute encephalopathy. Likely multifactorial, could be related to hypoglycemia, hypertensive encephalopathy, possible seizure, possible sepsis. He is still very confused and altered. CT head negative for acute process on admission. PCO2 on ABG was in normal range. Ammonia level normal. Urinalysis does not show any signs of infection. Chest x-ray does not show any pneumonia. Neurology consult has been requested and is pending. 2. Hypoglycemia. Possibly related to decreased by mouth intake. He is not on any oral hypoglycemics or insulin as an outpatient. Blood sugars have improved since admission. He is currently on dextrose infusion for hypernatremia. Continue to follow 3. Hypothermia. Etiologies not entirely clear. We'll place on warming blankets. Lactic acid is normal. Blood cultures are in process, but have not shown any growth as of yet. He is on empiric antibiotics. TSH and cortisol are in normal range. Could be related to his bradycardia. 4. Sinus bradycardia. Patient did receive intravenous Lopressor earlier this morning. Lopressor has since been discontinued. Possibly physiologic response related to his hypothermia. TSH in normal range. Continue to monitor on telemetry 5. Hypertension. Use when necessary hydralazine. Will start oral medications once his mental status is improving. Currently he is refusing any oral medications. 6. Possible seizure. The patient is chronically on Dilantin but has not been taking this at home. Dilantin level was undetectable on admission. He did receive a loading dose of intravenous Dilantin. He has been continued on maintenance dosing . EEG has been done with results pending. 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 Patient is followed at [facility]. He recently had a Port-A-Cath placed for chemotherapy. Eventual  plans are for surgery. He has not started chemotherapy as of yet. 8. Acute renal failure on chronic kidney disease stage IV. Creatinine remains elevated. We'll continue with IV fluids. 9. Hypernatremia. Likely related to decreased by mouth intake. Started on hypotonic fluids.  Code Status: full code Family Communication: discussed with multiple family members at the bedside Disposition Plan: pending hospital course   Consultants:  Neurology pending  Procedures:  EEG done, results pending  Antibiotics:  Vancomycin 8/16  Zosyn 8/16  HPI/Subjective: Patient is confused and unable to provide any history  Objective: Filed Vitals:   11/27/13 1511  BP: 167/52  Pulse: 78  Temp: 98.1 F (36.7 C)  Resp: 19    Intake/Output Summary (Last 24 hours) at 11/27/13 1757 Last data filed at 11/27/13 1312  Gross per 24 hour  Intake      0 ml  Output      0 ml  Net      0 ml   Filed Weights   11/25/13 1640 11/26/13 0145  Weight: 54.432 kg (120 lb) 51.256 kg (113 lb)    Exam:   General:  Patient has his eyes open, but does not respond answer questions or follow commands.  Cardiovascular: S1, S2 RRR  Respiratory: CTA B  Abdomen: soft, nt, nd, bs+  Musculoskeletal: no edema b/l   Data Reviewed: Basic Metabolic Panel:  Recent Labs Lab 11/22/13 1341 11/25/13 1715 11/26/13 0617 11/27/13 0602  NA 137 142 144 148*  K 4.5 4.0 4.2 4.2  CL 103 105 110 114*  CO2 21 22 21 22   GLUCOSE 75 167* 165* 94  BUN 29* 34* 32* 33*  CREATININE 4.32* 4.78* 4.34* 4.54*  CALCIUM 8.5 8.7 8.3* 8.0*   Liver Function Tests:  Recent Labs  Lab 11/25/13 1715 11/26/13 0617  AST 11 12  ALT 5 <5  ALKPHOS 97 103  BILITOT 0.2* 0.2*  PROT 5.9* 6.1  ALBUMIN 1.6* 1.7*   No results found for this basename: LIPASE, AMYLASE,  in the last 168 hours  Recent Labs Lab 11/25/13 1717  AMMONIA 23   CBC:  Recent Labs Lab 11/22/13 1341 11/25/13 1715 11/26/13 0617 11/27/13 0602  WBC  11.4* 9.5 12.5* 9.2  HGB 10.5* 9.7* 10.1* 8.6*  HCT 31.0* 29.0* 30.7* 26.1*  MCV 89.3 92.1 91.9 92.6  PLT 128* 124* 140* 108*   Cardiac Enzymes: No results found for this basename: CKTOTAL, CKMB, CKMBINDEX, TROPONINI,  in the last 168 hours BNP (last 3 results) No results found for this basename: PROBNP,  in the last 8760 hours CBG:  Recent Labs Lab 11/27/13 0339 11/27/13 0658 11/27/13 0755 11/27/13 1147 11/27/13 1651  GLUCAP 95 86 89 83 73    Recent Results (from the past 240 hour(s))  CULTURE, BLOOD (ROUTINE X 2)     Status: None   Collection Time    11/26/13 12:33 PM      Result Value Ref Range Status   Specimen Description PORTA CATH   Final   Special Requests BOTTLES DRAWN AEROBIC AND ANAEROBIC 8CC   Final   Culture NO GROWTH 1 DAY   Final   Report Status PENDING   Incomplete  CULTURE, BLOOD (ROUTINE X 2)     Status: None   Collection Time    11/26/13 12:39 PM      Result Value Ref Range Status   Specimen Description BLOOD RIGHT HAND   Final   Special Requests BOTTLES DRAWN AEROBIC ONLY 6CC   Final   Culture NO GROWTH 1 DAY   Final   Report Status PENDING   Incomplete     Studies: Ct Head Wo Contrast  11/25/2013   CLINICAL DATA:  Altered mental status. 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  EXAM: CT HEAD WITHOUT CONTRAST  TECHNIQUE: Contiguous axial images were obtained from the base of the skull through the vertex without intravenous contrast.  COMPARISON:  None.  FINDINGS: Large area of encephalomalacia from prior right MCA infarct. No acute intracranial abnormality. Specifically, no hemorrhage, hydrocephalus, mass lesion, acute infarction, or significant intracranial injury. No acute calvarial abnormality. Visualized paranasal sinuses and mastoids clear. Orbital soft tissues unremarkable.  Soft tissue mass partially imaged in the left upper lateral neck, presumably known soft tissue metastasis from patient's primary soft tissue cancer.  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 No acute intracranial abnormality.  Large upper left lateral neck mass, presumed known metastasis.   Electronically Signed   By: Rolm Baptise M.D.   On: 11/25/2013 18:12   Ct Soft Tissue Neck Wo Contrast  11/25/2013   CLINICAL DATA:  Left neck swelling. History of cancer. End-stage renal disease.  EXAM: CT NECK WITHOUT CONTRAST  TECHNIQUE: Multidetector CT imaging of the neck was performed following the standard protocol without intravenous contrast.  COMPARISON:  CT of the head November 25, 2013 at 1810 hr.  FINDINGS: Limited noncontrast CT of the neck.  At least 4.2 x 3 cm mass within the oropharynx, this appears to arise from the left palatine tonsil, though there is abnormal fullness of the left nasopharyngeal soft tissues/ torus tubarius. Preservation the left parapharyngeal fat tissue planes. Mass effaces the oropharynx. No extension to the hypopharynx. The larynx is unremarkable.  Massive left greater than right cervical  lymphadenopathy, including a 7.8 x 5.8 cm necrotic mass inseparable from the superior aspect of the sternocleidomastoid muscle, extending into the carotid space with additional probable left level IIa 2.6 x 2.5 cm lymph node. Right level the 3 2.8 x 2.8 cm nodal metastasis. Smaller though pathologically enlarged lymph nodes at level 3 on the left, level 4 bilaterally.  Mass effect on the inferior aspect of the left parotid gland, major salivary glands are otherwise unremarkable for this nonenhanced examination. Heterogeneous thyroid gland without dominant nodule.  Mild calcific atherosclerosis of the carotid bifurcations. Dense vaso vasorum may reflect anemia.  Remote right clavicle fracture. No destructive bony lesions. Paranasal sinuses are well aerated. Coalescent left mastoid air cells without postobstructive effusion. Soft tissue within the left external auditory canal likely reflects cerumen. Patient is edentulous.  IMPRESSION: Limited  noncontrast CT of the neck.  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  Massive left greater than right pathologic cervical lymphadenopathy, largest on the left at 7.8 x 5.8 cm (likely reflecting level 2b/3 nodal conglomeration.  Findings discussed with and reconfirmed by Dr.JON KNAPP on8/15/2015at11:30 pm.   Electronically Signed   By: Elon Alas   On: 11/25/2013 23:38    Scheduled Meds: . amLODipine  10 mg Oral q morning - 10a  . enoxaparin (LOVENOX) injection  30 mg Subcutaneous Q24H  . fluticasone  2 spray Each Nare QHS  . phenytoin (DILANTIN) IV  100 mg Intravenous 3 times per day  . piperacillin-tazobactam (ZOSYN)  IV  2.25 g Intravenous 3 times per day  . simvastatin  10 mg Oral QHS  . sodium chloride  3 mL Intravenous Q12H  . [START ON 11/28/2013] vancomycin  500 mg Intravenous Q48H   Continuous Infusions: . dextrose 5 % and 0.45% NaCl 100 mL/hr at 11/27/13 1117    Active Problems:   HYPERTENSION   SEIZURE DISORDER   Acute on chronic renal failure   Hypoglycemia   Chronic kidney disease, stage IV (severe)   Cancer of soft palate   Altered mental status   Encephalopathy acute   Hypothermia    Time spent: 6mins    Lamine Laton  [facility] Pager [phone]. If 7PM-7AM, please contact night-coverage at [email or web address], password [facility] 11/27/2013, 5:57 PM  LOS: 2 days

## 2013-11-27 NOTE — Procedures (Signed)
  [place] A. Merlene Laughter, MD     [email or web address]           HISTORY: This is a 63 year old man who presents with altered mental status and confusion. There is a baseline history of seizures.  MEDICATIONS: Scheduled Meds: . amLODipine  10 mg Oral q morning - 10a  . enoxaparin (LOVENOX) injection  30 mg Subcutaneous Q24H  . fluticasone  2 spray Each Nare QHS  . phenytoin (DILANTIN) IV  100 mg Intravenous 3 times per day  . piperacillin-tazobactam (ZOSYN)  IV  2.25 g Intravenous 3 times per day  . simvastatin  10 mg Oral QHS  . sodium chloride  3 mL Intravenous Q12H  . [START ON 11/28/2013] vancomycin  500 mg Intravenous Q48H   Continuous Infusions: . dextrose 5 % and 0.45% NaCl 100 mL/hr at 11/27/13 1117   PRN Meds:.acetaminophen, acetaminophen, alum & mag hydroxide-simeth, docusate sodium, hydrALAZINE, LORazepam, oxyCODONE, prochlorperazine  Prior to Admission medications   Medication Sig Start Date End Date Taking? Authorizing Provider  amLODipine (NORVASC) 10 MG tablet Take 10 mg by mouth every morning.    Yes Historical Provider, MD  phenytoin (DILANTIN) 100 MG ER capsule Take 200 mg by mouth at bedtime.    Yes Historical Provider, MD      ANALYSIS: A 16 channel recording using standard 10 20 measurements is conducted for Approximately 20 minutes. The background activity gets as high as 6 Hz. However, the recording is replete with delta slowing approximately 3-4 Hz. Occasional sleep activities seen With spindles Observed. No focal or lateralized slowing is observed. There are no epileptiform activities observed. No triphasic waves are observed.   IMPRESSION: 1. Moderate generalized slowing indicating a moderate generalized encephalopathy. However, there are no epileptiform activities observed.      Ceili Boshers A. Merlene Laughter, M.D.  Diplomate, Tax adviser of Psychiatry and Neurology ( Neurology).

## 2013-11-27 NOTE — Progress Notes (Signed)
EEG Completed; Results Pending  

## 2013-11-28 DIAGNOSIS — E872 Acidosis: Secondary | ICD-10-CM | POA: Diagnosis present

## 2013-11-28 DIAGNOSIS — J9600 Acute respiratory failure, unspecified whether with hypoxia or hypercapnia: Secondary | ICD-10-CM

## 2013-11-28 DIAGNOSIS — E8729 Other acidosis: Secondary | ICD-10-CM | POA: Diagnosis present

## 2013-11-28 DIAGNOSIS — J9602 Acute respiratory failure with hypercapnia: Secondary | ICD-10-CM | POA: Diagnosis not present

## 2013-11-28 DIAGNOSIS — E43 Unspecified severe protein-calorie malnutrition: Secondary | ICD-10-CM | POA: Diagnosis present

## 2013-11-28 LAB — BLOOD GAS, VENOUS
ACID-BASE DEFICIT: 7.4 mmol/L — AB (ref 0.0–2.0)
BICARBONATE: 20.9 meq/L (ref 20.0–24.0)
Drawn by: 27407
O2 CONTENT: 3 L/min
O2 SAT: 76.6 %
PO2 VEN: 44.2 mmHg (ref 30.0–45.0)
TCO2: 20.9 mmol/L (ref 0–100)
pCO2, Ven: 61 mmHg — ABNORMAL HIGH (ref 45.0–50.0)
pH, Ven: 7.16 — CL (ref 7.250–7.300)

## 2013-11-28 LAB — BASIC METABOLIC PANEL
Anion gap: 11 (ref 5–15)
BUN: 32 mg/dL — ABNORMAL HIGH (ref 6–23)
CALCIUM: 7.5 mg/dL — AB (ref 8.4–10.5)
CO2: 21 mEq/L (ref 19–32)
CREATININE: 5.07 mg/dL — AB (ref 0.50–1.35)
Chloride: 113 mEq/L — ABNORMAL HIGH (ref 96–112)
GFR, EST AFRICAN AMERICAN: 13 mL/min — AB (ref >90)
GFR, EST NON AFRICAN AMERICAN: 11 mL/min — AB (ref >90)
Glucose, Bld: 179 mg/dL — ABNORMAL HIGH (ref 70–99)
Potassium: 3.7 mEq/L (ref 3.7–5.3)
Sodium: 145 mEq/L (ref 137–147)

## 2013-11-28 LAB — GLUCOSE, CAPILLARY
GLUCOSE-CAPILLARY: 103 mg/dL — AB (ref 70–99)
GLUCOSE-CAPILLARY: 156 mg/dL — AB (ref 70–99)
GLUCOSE-CAPILLARY: 163 mg/dL — AB (ref 70–99)
Glucose-Capillary: 144 mg/dL — ABNORMAL HIGH (ref 70–99)
Glucose-Capillary: 171 mg/dL — ABNORMAL HIGH (ref 70–99)
Glucose-Capillary: 201 mg/dL — ABNORMAL HIGH (ref 70–99)

## 2013-11-28 LAB — CBC
HEMATOCRIT: 25 % — AB (ref 39.0–52.0)
Hemoglobin: 8.4 g/dL — ABNORMAL LOW (ref 13.0–17.0)
MCH: 31.2 pg (ref 26.0–34.0)
MCHC: 33.6 g/dL (ref 30.0–36.0)
MCV: 92.9 fL (ref 78.0–100.0)
PLATELETS: 95 10*3/uL — AB (ref 150–400)
RBC: 2.69 MIL/uL — ABNORMAL LOW (ref 4.22–5.81)
RDW: 17.1 % — AB (ref 11.5–15.5)
WBC: 9.4 10*3/uL (ref 4.0–10.5)

## 2013-11-28 MED ORDER — PHENYTOIN SODIUM 50 MG/ML IJ SOLN: 100.0000 mg | Freq: Three times a day (TID) | INTRAMUSCULAR | Status: AC

## 2013-11-28 MED ORDER — PIPERACILLIN-TAZOBACTAM IN DEX 2-0.25 GM/50ML IV SOLN: 2.2500 g | Freq: Three times a day (TID) | INTRAVENOUS | Status: AC

## 2013-11-28 MED ORDER — SIMVASTATIN 10 MG PO TABS: 10.0000 mg | ORAL_TABLET | Freq: Every day | ORAL | Status: AC

## 2013-11-28 MED ORDER — DOPAMINE-DEXTROSE 3.2-5 MG/ML-% IV SOLN
2.0000 ug/kg/min | INTRAVENOUS | Status: DC
Start: 2013-11-28 — End: 2013-11-29
  Administered 2013-11-28: 10 ug/kg/min via INTRAVENOUS

## 2013-11-28 MED ORDER — DOPAMINE-DEXTROSE 3.2-5 MG/ML-% IV SOLN
INTRAVENOUS | Status: AC
  Administered 2013-11-28: 10 ug/kg/min via INTRAVENOUS
  Filled 2013-11-28: qty 250

## 2013-11-28 MED ORDER — VANCOMYCIN HCL 500 MG IV SOLR: 500.0000 mg | INTRAVENOUS | Status: AC

## 2013-11-28 MED ORDER — PROCHLORPERAZINE MALEATE 10 MG PO TABS: 10.0000 mg | ORAL_TABLET | Freq: Four times a day (QID) | ORAL | Status: AC | PRN

## 2013-11-28 MED ORDER — HYDRALAZINE HCL 20 MG/ML IJ SOLN: 10.0000 mg | Freq: Four times a day (QID) | INTRAMUSCULAR | Status: AC | PRN

## 2013-11-28 NOTE — Discharge Planning (Signed)
Report called to [facility], RN). Pt will be transported to [facility]

## 2013-11-28 NOTE — Clinical Social Work Note (Signed)
CSW met with pt's daughter Biehn at bedside. Family was just notified that pt would be transferring to [facility] when bed available. Aware that case management/social work there will assist with d/c planning when appropriate. Daughter concerned about who will make decisions. She reports that there is no named HCPOA. She states she is pt's only child and he is not married. Pt feels more reassured that as next of kin, she can make decisions as pt is currently unable.   Benay Pike, [place]

## 2013-11-28 NOTE — Plan of Care (Addendum)
Pt condition has changed and seems to be deteriorating.  Resp have dropped to 12/min and pt is non responsive to family or staff.  Dr. Informed and suggested transfer to ICU.  Will check on possibilities for transfer to [facility] - since [place] still doesn't have a bed available.  Family informed.  Report called to Miquel Dunn, RN - pt transferring to Rm 6.

## 2013-11-28 NOTE — Progress Notes (Signed)
PT Cancellation Note  Patient Details Name: COMMODORE BELLEW MRN: [id number] DOB: 27-Mar-1951   Cancelled Treatment:    Reason Eval/Treat Not Completed: Fatigue/lethargy limiting ability to participate  Received orders and chart reviewed.  Pt lethargic and unable to actively participate in PT evaluation.  Will reattempt evaluation as able.    Fifi Schindler 11/28/2013, 11:14 AM

## 2013-11-28 NOTE — Progress Notes (Signed)
This encounter was created in error - please disregard.

## 2013-11-28 NOTE — Plan of Care (Signed)
Dr. Kandice Robinsons about pt's Blood sugars trending up with no coverage ordered.  Dr. Lenon Ahmadi that since pt tends to be hypoglycemic - unless pt goes over 250 - it should be fine for now.

## 2013-11-28 NOTE — Progress Notes (Signed)
[facility] PROGRESS NOTE  Mark Sanford UKG:[id number] DOB: April 08, 1951 DOA: 11/25/2013 PCP: Dionisio Paschal, NP  Assessment/Plan: 1. Acute encephalopathy. Likely multifactorial, could be related to hypoglycemia, hypertensive encephalopathy, possible seizure, possible sepsis. He is still lethargic an does not follow commands. CT head negative for acute process on admission. PCO2 on ABG was in normal range. Ammonia level normal. Urinalysis does not show any signs of infection. Chest x-ray does not show any pneumonia. Neurology has seen the patient and feels that his encephalopathy is likely metabolic related. It was felt that if patient fails to improve, he may need a lumbar puncture. 2. Hypoglycemia. Possibly related to decreased by mouth intake. He is not on any oral hypoglycemics or insulin as an outpatient. Blood sugars have improved since admission. He is currently on dextrose infusion for hypernatremia. Continue to follow 3. Hypothermia. Etiologies not entirely clear, possibly related to underlying sepsis. We'll place on warming blankets. Lactic acid is normal. Blood cultures do not show any growth to date. He is on empiric antibiotics with vancomycin and zosyn. TSH and cortisol are in normal range.  4. Sinus bradycardia. Likely related to hypothermia. Appears to have resolved as his temperature improved 5. Hypertension. Use when necessary hydralazine. Will start oral medications once his mental status is improving.  6. Possible seizure. The patient is chronically on Dilantin but has not been taking this at home. Dilantin level was undetectable on admission. He did receive a loading dose of intravenous Dilantin. He has been continued on maintenance dosing . EEG has been done which does not show any epileptiform discharges. 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 Patient is followed at [facility]. He recently had a Port-A-Cath placed for chemotherapy. Eventual plans are for  surgery. He has not started chemotherapy as of yet. 8. Acute renal failure on chronic kidney disease stage IV. Creatinine continues to trend up. He has an AV fistula in place, but has not undergone dialysis yet. Baseline creatinine appears to be around 4. Continue on IV fluids for now. 9. Hypernatremia. Likely related to decreased by mouth intake. Improved with hypotonic fluids  Code Status: full code Family Communication: discussed with multiple family members at the bedside Disposition Plan: Due to his refractory encephalopathy, medical complexity and underlying soft palate cancer, I have discussed the case with Dr. Gaylyn Rong at [facility] who has accepted the patient in transfer. The patient will benefit from tertiary care evaluation and may need ENT if airway becomes a problem. We are currently awaiting a bed.   Consultants:  Neurology  Procedures:  EEG done with no epileptiform discharges. It did show moderate generalized slowing indicating a moderate generalized encephalopathy.  Antibiotics:  Vancomycin 8/16>>  Zosyn 8/16>>  HPI/Subjective: Patient is lethargic and unable to provide any history  Objective: Filed Vitals:   11/28/13 0500  BP: 143/82  Pulse: 77  Temp: 97.6 F (36.4 C)  Resp: 24    Intake/Output Summary (Last 24 hours) at 11/28/13 1116 Last data filed at 11/27/13 1859  Gross per 24 hour  Intake    120 ml  Output     75 ml  Net     45 ml   Filed Weights   11/25/13 1640 11/26/13 0145  Weight: 54.432 kg (120 lb) 51.256 kg (113 lb)    Exam:   General:  Patient has his eyes open, but does not respond, answer questions or follow commands.  Cardiovascular: S1, S2 RRR  Respiratory: CTA B  Abdomen: soft, nt, nd, bs+  Musculoskeletal: no edema b/l   Data Reviewed: Basic Metabolic Panel:  Recent Labs Lab 11/22/13 1341 11/25/13 1715 11/26/13 0617 11/27/13 0602 11/28/13 0551  NA 137 142 144 148* 145  K 4.5 4.0 4.2 4.2 3.7   CL 103 105 110 114* 113*  CO2 21 22 21 22 21   GLUCOSE 75 167* 165* 94 179*  BUN 29* 34* 32* 33* 32*  CREATININE 4.32* 4.78* 4.34* 4.54* 5.07*  CALCIUM 8.5 8.7 8.3* 8.0* 7.5*   Liver Function Tests:  Recent Labs Lab 11/25/13 1715 11/26/13 0617  AST 11 12  ALT 5 <5  ALKPHOS 97 103  BILITOT 0.2* 0.2*  PROT 5.9* 6.1  ALBUMIN 1.6* 1.7*   No results found for this basename: LIPASE, AMYLASE,  in the last 168 hours  Recent Labs Lab 11/25/13 1717  AMMONIA 23   CBC:  Recent Labs Lab 11/22/13 1341 11/25/13 1715 11/26/13 0617 11/27/13 0602 11/28/13 0551  WBC 11.4* 9.5 12.5* 9.2 9.4  HGB 10.5* 9.7* 10.1* 8.6* 8.4*  HCT 31.0* 29.0* 30.7* 26.1* 25.0*  MCV 89.3 92.1 91.9 92.6 92.9  PLT 128* 124* 140* 108* 95*   Cardiac Enzymes: No results found for this basename: CKTOTAL, CKMB, CKMBINDEX, TROPONINI,  in the last 168 hours BNP (last 3 results) No results found for this basename: PROBNP,  in the last 8760 hours CBG:  Recent Labs Lab 11/27/13 1651 11/27/13 2315 11/28/13 0033 11/28/13 0527 11/28/13 0719  GLUCAP 73 126* 103* 144* 171*    Recent Results (from the past 240 hour(s))  CULTURE, BLOOD (ROUTINE X 2)     Status: None   Collection Time    11/26/13 12:33 PM      Result Value Ref Range Status   Specimen Description BLOOD PORTA CATH DRAWN BY RN   Final   Special Requests BOTTLES DRAWN AEROBIC AND ANAEROBIC 8CC   Final   Culture NO GROWTH 2 DAYS   Final   Report Status PENDING   Incomplete  CULTURE, BLOOD (ROUTINE X 2)     Status: None   Collection Time    11/26/13 12:39 PM      Result Value Ref Range Status   Specimen Description BLOOD RIGHT HAND   Final   Special Requests BOTTLES DRAWN AEROBIC ONLY [place]   Final   Culture NO GROWTH 2 DAYS   Final   Report Status PENDING   Incomplete     Studies: No results found.  Scheduled Meds: . amLODipine  10 mg Oral q morning - 10a  . enoxaparin (LOVENOX) injection  30 mg Subcutaneous Q24H  . fluticasone  2  spray Each Nare QHS  . phenytoin (DILANTIN) IV  100 mg Intravenous 3 times per day  . piperacillin-tazobactam (ZOSYN)  IV  2.25 g Intravenous 3 times per day  . simvastatin  10 mg Oral QHS  . sodium chloride  3 mL Intravenous Q12H  . vancomycin  500 mg Intravenous Q48H   Continuous Infusions: . dextrose 5 % and 0.45% NaCl 100 mL/hr at 11/28/13 0128    Active Problems:   HYPERTENSION   SEIZURE DISORDER   Acute on chronic renal failure   Hypoglycemia   Chronic kidney disease, stage IV (severe)   Cancer of soft palate   Altered mental status   Encephalopathy acute   Hypothermia    Time spent: 40mins    Allona Gondek  [facility] Pager [phone]. If 7PM-7AM, please contact night-coverage at [email or web address], password [facility]  11/28/2013, 11:16 AM  LOS: 3 days

## 2013-11-28 NOTE — Discharge Summary (Signed)
Physician Discharge Summary  Mark Sanford WEX:[id number] DOB: [birth year] DOA: 11/25/2013  PCP: Dionisio Paschal, NP  Admit date: 11/25/2013 Discharge date: 11/28/2013  Time spent: 50 minutes  Recommendations for Outpatient Follow-up:  1. Patient is being transferred to [facility] for further care 2. He is currently on Bipap for respiratory acidosis  Discharge Diagnoses:  Principal Problem:   Encephalopathy acute Active Problems:   HYPERTENSION   SEIZURE DISORDER   Acute on chronic renal failure   Hypoglycemia   Chronic kidney disease, stage IV (severe)   Cancer of soft palate   Altered mental status   Hypothermia   Protein-calorie malnutrition, severe   Acute respiratory failure with hypercapnia   Respiratory acidosis   Discharge Condition: gaurded  Diet recommendation: currently npo  Filed Weights   11/25/13 1640 11/26/13 0145  Weight: 54.432 kg (120 lb) 51.256 kg (113 lb)    History of present illness and hospital course:  This is a 63 year old gentleman with a 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 He was recently seen at [facility] by ENT, and plans were to pursue chemotherapy as well as surgery. He recently had a Port-A-Cath placed for chemotherapy last week prior to admission. He presents to the emergency room with progressive lethargy and altered mental status. He was noted to be hypoglycemic by EMS and received glucose supplementation. The patient is not a diabetic and is on any medications for diabetes. His hypoglycemia corrected with administration of dextrose, although his mental status did not return to baseline. He was also noted to be significantly hypothermic on admission with a rectal temperature of 91. He was placed on warming blankets which improved his rectal temperature to normal, although he still needs warming blankets to maintain his temperature. TSH and cortisol  were found to be in normal range. He did not have any clear source of sepsis. Chest x-ray did not show any acute findings, urinalysis did not show any infection. Blood cultures have shown no growth. He was started empirically on vancomycin and Zosyn, currently day 3 of treatments. Patient also reportedly had a seizure disorder and was not taking Dilantin. His Dilantin level was subtherapeutic on admission. He was loaded with Dilantin in the emergency room and continued on maintenance dosing. EEG did not show any epileptiform discharges. Etiology of his encephalopathy is not entirely clear. It may be related to sepsis. Currently, his declining mental status is likely related to his respiratory acidosis. Ammonia was checked and was also found to be normal. CT scan of the head done on admission was unremarkable. Since his mental status did not improve, he was seen by neurology who recommended that he may need a lumbar puncture for further evaluation. The patient significant oropharyngeal cancer, if he decompensates and securing an airway will be challenging. For this reason, he'll be better served at [facility] where ENT services are available if necessary. Through the course of the day on 8/18, patient was noted to be increasingly lethargic. Her PCO2 was noted to be rising his 60s and pH was low at 7.16. He has been started on BiPAP therapy. I have discussed the case with Dr. Gaylyn Rong who has accepted the patient in transfer to an ICU bed.  Procedures: EEG done with no epileptiform discharges. It did show moderate generalized slowing indicating a moderate generalized encephalopathy.   Consultations:  Neurology  Discharge Exam: Filed Vitals:   11/28/13 1700  BP: 172/68  Pulse: 57  Temp: 96.8 F (36 C)  Resp: 12    General: does not respond to voice, or sternal rub Cardiovascular: s1, s2, rrr Respiratory: CTA B     Medication List    STOP taking these medications        phenytoin 100 MG ER capsule  Commonly known as:  DILANTIN      TAKE these medications       amLODipine 10 MG tablet  Commonly known as:  NORVASC  Take 10 mg by mouth every morning.     hydrALAZINE 20 MG/ML injection  Commonly known as:  APRESOLINE  Inject 0.5 mLs (10 mg total) into the vein every 6 (six) hours as needed (sbp>170).     phenytoin 50 MG/ML injection  Commonly known as:  DILANTIN  Inject 2 mLs (100 mg total) into the vein every 8 (eight) hours.     piperacillin-tazobactam 2-0.25 GM/50ML IVPB  Commonly known as:  ZOSYN  Inject 50 mLs (2.25 g total) into the vein every 8 (eight) hours.     prochlorperazine 10 MG tablet  Commonly known as:  COMPAZINE  Take 1 tablet (10 mg total) by mouth every 6 (six) hours as needed for nausea or vomiting.     simvastatin 10 MG tablet  Commonly known as:  ZOCOR  Take 1 tablet (10 mg total) by mouth at bedtime.     vancomycin 500 mg in sodium chloride 0.9 % 100 mL  Inject 500 mg into the vein every other day.       No Known Allergies Follow-up Information   Follow up with Dionisio Paschal, NP. (make sure to monitor your blood sugar, eat your meals regularly)    Specialty:  Nurse Practitioner       The results of significant diagnostics from this hospitalization (including imaging, microbiology, ancillary and laboratory) are listed below for reference.    Significant Diagnostic Studies: Dg Chest 1 View  11/25/2013   CLINICAL DATA:  Altered mental status  EXAM: CHEST - 1 VIEW  COMPARISON:  09/13/2013  FINDINGS: Right-sided Port-A-Cath terminates at the mid to low SVC.  Midline trachea. Borderline cardiomegaly. No pleural effusion or pneumothorax. No congestive failure.  IMPRESSION: Borderline cardiomegaly, without acute disease.   Electronically Signed   By: Abigail Miyamoto M.D.   On: 11/25/2013 17:58   Ct Head Wo Contrast  11/25/2013   CLINICAL DATA:  Altered mental status. 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   EXAM: CT HEAD WITHOUT CONTRAST  TECHNIQUE: Contiguous axial images were obtained from the base of the skull through the vertex without intravenous contrast.  COMPARISON:  None.  FINDINGS: Large area of encephalomalacia from prior right MCA infarct. No acute intracranial abnormality. Specifically, no hemorrhage, hydrocephalus, mass lesion, acute infarction, or significant intracranial injury. No acute calvarial abnormality. Visualized paranasal sinuses and mastoids clear. Orbital soft tissues unremarkable.  Soft tissue mass partially imaged in the left upper lateral neck, presumably known soft tissue metastasis from patient's primary soft tissue cancer.  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 No acute intracranial abnormality.  Large upper left lateral neck mass, presumed known metastasis.   Electronically Signed   By: Rolm Baptise M.D.   On: 11/25/2013 18:12   Ct Soft Tissue Neck Wo Contrast  11/25/2013   CLINICAL DATA:  Left neck swelling. History of cancer. End-stage renal disease.  EXAM: CT NECK WITHOUT CONTRAST  TECHNIQUE: Multidetector CT imaging of the neck was performed  following the standard protocol without intravenous contrast.  COMPARISON:  CT of the head November 25, 2013 at 1810 hr.  FINDINGS: Limited noncontrast CT of the neck.  At least 4.2 x 3 cm mass within the oropharynx, this appears to arise from the left palatine tonsil, though there is abnormal fullness of the left nasopharyngeal soft tissues/ torus tubarius. Preservation the left parapharyngeal fat tissue planes. Mass effaces the oropharynx. No extension to the hypopharynx. The larynx is unremarkable.  Massive left greater than right cervical lymphadenopathy, including a 7.8 x 5.8 cm necrotic mass inseparable from the superior aspect of the sternocleidomastoid muscle, extending into the carotid space with additional probable left level IIa 2.6 x 2.5 cm lymph node. Right level the 3 2.8 x 2.8 cm nodal metastasis. Smaller  though pathologically enlarged lymph nodes at level 3 on the left, level 4 bilaterally.  Mass effect on the inferior aspect of the left parotid gland, major salivary glands are otherwise unremarkable for this nonenhanced examination. Heterogeneous thyroid gland without dominant nodule.  Mild calcific atherosclerosis of the carotid bifurcations. Dense vaso vasorum may reflect anemia.  Remote right clavicle fracture. No destructive bony lesions. Paranasal sinuses are well aerated. Coalescent left mastoid air cells without postobstructive effusion. Soft tissue within the left external auditory canal likely reflects cerumen. Patient is edentulous.  IMPRESSION: Limited noncontrast CT of the neck.  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  Massive left greater than right pathologic cervical lymphadenopathy, largest on the left at 7.8 x 5.8 cm (likely reflecting level 2b/3 nodal conglomeration.  Findings discussed with and reconfirmed by Dr.JON KNAPP on8/15/2015at11:30 pm.   Electronically Signed   By: Elon Alas   On: 11/25/2013 23:38   Ir Fluoro Guide Cv Line Right  11/22/2013   CLINICAL DATA:  63 year old with cancer of the soft palate.  EXAM: FLUOROSCOPIC AND ULTRASOUND GUIDED PLACEMENT OF A SUBCUTANEOUS PORT.  Physician: Stephan Minister. Anselm Pancoast, MD  FLUOROSCOPY TIME:  36 seconds  MEDICATIONS AND MEDICAL HISTORY: 2 g Ancef. As antibiotic prophylaxis, Ancef was ordered pre-procedure and administered intravenously within one hour of incision.  ANESTHESIA/SEDATION: Moderate sedation time: None  PROCEDURE: The risks of the procedure were explained to the patient. Informed consent was obtained. Patient was placed supine on the  interventional table. Ultrasound confirmed a patent right internal jugular vein. The right chest and neck were cleaned with a skin antiseptic and a sterile drape was placed. Maximal barrier sterile technique was utilized including caps, mask, sterile gowns, sterile gloves, sterile drape, hand hygiene and skin antiseptic. The right neck was anesthetized with 1% lidocaine. Small incision was made in the right neck with a blade. Micropuncture set was placed in the right internal jugular vein with ultrasound guidance. The micropuncture wire was used for measurement purposes. The right chest was anesthetized with 1% lidocaine with epinephrine. #15 blade was used to make an incision and a subcutaneous port pocket was formed. [place] was assembled. Subcutaneous tunnel was formed with a stiff tunneling device. The port catheter was brought through the subcutaneous tunnel. The port was placed in the subcutaneous pocket. The micropuncture set was exchanged for a peel-away sheath. The catheter was placed through the peel-away sheath and the tip was positioned  at the superior cavoatrial junction. Catheter placement was confirmed with fluoroscopy. The port was accessed and flushed with heparinized saline. The port pocket was closed using two layers of absorbable sutures and Dermabond. The vein skin site was closed using a single layer of absorbable suture and Dermabond. Sterile dressings were applied. Patient tolerated the procedure well without an immediate complication. Ultrasound and fluoroscopic images were taken and saved for this procedure.  COMPLICATIONS: None  IMPRESSION: Placement of a subcutaneous port device. The catheter tip at the superior cavoatrial junction and ready to be used.   Electronically Signed   By: Markus Daft M.D.   On: 11/22/2013 18:05   Ir US Guide Vasc Access Right  11/22/2013   CLINICAL DATA:  63 year old with cancer of the soft palate.  EXAM: FLUOROSCOPIC AND ULTRASOUND GUIDED  PLACEMENT OF A SUBCUTANEOUS PORT.  Physician: Stephan Minister. Anselm Pancoast, MD  FLUOROSCOPY TIME:  36 seconds  MEDICATIONS AND MEDICAL HISTORY: 2 g Ancef. As antibiotic prophylaxis, Ancef was ordered pre-procedure and administered intravenously within one hour of incision.  ANESTHESIA/SEDATION: Moderate sedation time: None  PROCEDURE: The risks of the procedure were explained to the patient. Informed consent was obtained. Patient was placed supine on the interventional table. Ultrasound confirmed a patent right internal jugular vein. The right chest and neck were cleaned with a skin antiseptic and a sterile drape was placed. Maximal barrier sterile technique was utilized including caps, mask, sterile gowns, sterile gloves, sterile drape, hand hygiene and skin antiseptic. The right neck was anesthetized with 1% lidocaine. Small incision was made in the right neck with a blade. Micropuncture set was placed in the right internal jugular vein with ultrasound guidance. The micropuncture wire was used for measurement purposes. The right chest was anesthetized with 1% lidocaine with epinephrine. #15 blade was used to make an incision and a subcutaneous port pocket was formed. [place] was assembled. Subcutaneous tunnel was formed with a stiff tunneling device. The port catheter was brought through the subcutaneous tunnel. The port was placed in the subcutaneous pocket. The micropuncture set was exchanged for a peel-away sheath. The catheter was placed through the peel-away sheath and the tip was positioned at the superior cavoatrial junction. Catheter placement was confirmed with fluoroscopy. The port was accessed and flushed with heparinized saline. The port pocket was closed using two layers of absorbable sutures and Dermabond. The vein skin site was closed using a single layer of absorbable suture and Dermabond. Sterile dressings were applied. Patient tolerated the procedure well without an immediate complication. Ultrasound  and fluoroscopic images were taken and saved for this procedure.  COMPLICATIONS: None  IMPRESSION: Placement of a subcutaneous port device. The catheter tip at the superior cavoatrial junction and ready to be used.   Electronically Signed   By: Markus Daft M.D.   On: 11/22/2013 18:05    Microbiology: Recent Results (from the past 240 hour(s))  CULTURE, BLOOD (ROUTINE X 2)     Status: None   Collection Time    11/26/13 12:33 PM      Result Value Ref Range Status   Specimen Description BLOOD PORTA CATH DRAWN BY RN   Final   Special Requests BOTTLES DRAWN AEROBIC AND ANAEROBIC 8CC   Final   Culture NO GROWTH 2 DAYS   Final   Report Status PENDING   Incomplete  CULTURE, BLOOD (ROUTINE X 2)     Status: None   Collection Time    11/26/13 12:39 PM  Result Value Ref Range Status   Specimen Description BLOOD RIGHT HAND   Final   Special Requests BOTTLES DRAWN AEROBIC ONLY 6CC   Final   Culture NO GROWTH 2 DAYS   Final   Report Status PENDING   Incomplete     Labs: Basic Metabolic Panel:  Recent Labs Lab 11/22/13 1341 11/25/13 1715 11/26/13 0617 11/27/13 0602 11/28/13 0551  NA 137 142 144 148* 145  K 4.5 4.0 4.2 4.2 3.7  CL 103 105 110 114* 113*  CO2 21 22 21 22 21   GLUCOSE 75 167* 165* 94 179*  BUN 29* 34* 32* 33* 32*  CREATININE 4.32* 4.78* 4.34* 4.54* 5.07*  CALCIUM 8.5 8.7 8.3* 8.0* 7.5*   Liver Function Tests:  Recent Labs Lab 11/25/13 1715 11/26/13 0617  AST 11 12  ALT 5 <5  ALKPHOS 97 103  BILITOT 0.2* 0.2*  PROT 5.9* 6.1  ALBUMIN 1.6* 1.7*   No results found for this basename: LIPASE, AMYLASE,  in the last 168 hours  Recent Labs Lab 11/25/13 1717  AMMONIA 23   CBC:  Recent Labs Lab 11/22/13 1341 11/25/13 1715 11/26/13 0617 11/27/13 0602 11/28/13 0551  WBC 11.4* 9.5 12.5* 9.2 9.4  HGB 10.5* 9.7* 10.1* 8.6* 8.4*  HCT 31.0* 29.0* 30.7* 26.1* 25.0*  MCV 89.3 92.1 91.9 92.6 92.9  PLT 128* 124* 140* 108* 95*   Cardiac Enzymes: No results found  for this basename: CKTOTAL, CKMB, CKMBINDEX, TROPONINI,  in the last 168 hours BNP: BNP (last 3 results) No results found for this basename: PROBNP,  in the last 8760 hours CBG:  Recent Labs Lab 11/28/13 0033 11/28/13 0527 11/28/13 0719 11/28/13 1154 11/28/13 1640  GLUCAP 103* 144* 171* 201* 163*       Signed:  Karey Stucki  [facility] 11/28/2013, 6:05 PM

## 2013-11-28 NOTE — Progress Notes (Signed)
Pt transported to ICU and placed on BIPAP per MD order.  Pt tolerating well at this time. RT will continue to monitor.

## 2013-11-28 NOTE — Progress Notes (Signed)
Shift Summary Complete assessment done, see main flowsheet for details; patient on BiPaP having periods of apnea, O2 sats remaining in the upper 90's to 100%, BiPaP on 40%; vital signs stable at the start of the shift; patient moved over from ICU bed onto [facility] stretcher and SBP dropped to 74; NS bolus of 250 given by paramedic; Physician paged and obtained order for Dopamine drip; [facility] RN started Dopamine and titrated until SBp 84; Dr Nehemiah Settle paged and came to the unit and talked with the transport team about the patients condition; patient was regulated on transport bipap, removed from bedside cardiac monitor; family in the unit was updated on patient condition; patient left with [facility] team at 2135

## 2013-12-01 ENCOUNTER — Inpatient Hospital Stay (HOSPITAL_COMMUNITY): Payer: Medicaid Other

## 2013-12-01 ENCOUNTER — Ambulatory Visit (HOSPITAL_COMMUNITY): Payer: Medicaid Other

## 2013-12-01 LAB — CULTURE, BLOOD (ROUTINE X 2)
Culture: NO GROWTH
Culture: NO GROWTH

## 2013-12-04 ENCOUNTER — Inpatient Hospital Stay (HOSPITAL_COMMUNITY): Payer: Medicaid Other

## 2013-12-04 ENCOUNTER — Ambulatory Visit (HOSPITAL_COMMUNITY): Payer: Medicaid Other

## 2013-12-08 ENCOUNTER — Inpatient Hospital Stay (HOSPITAL_COMMUNITY): Payer: Medicaid Other

## 2013-12-08 ENCOUNTER — Ambulatory Visit (HOSPITAL_COMMUNITY): Payer: Medicaid Other | Admitting: Oncology

## 2013-12-11 ENCOUNTER — Ambulatory Visit (HOSPITAL_COMMUNITY): Payer: Medicaid Other

## 2013-12-11 ENCOUNTER — Inpatient Hospital Stay (HOSPITAL_COMMUNITY): Payer: Medicaid Other

## 2013-12-11 NOTE — Progress Notes (Signed)
This encounter was created in error - please disregard.

## 2013-12-14 ENCOUNTER — Encounter (HOSPITAL_COMMUNITY): Payer: Self-pay

## 2013-12-15 ENCOUNTER — Inpatient Hospital Stay (HOSPITAL_COMMUNITY): Payer: Medicaid Other

## 2013-12-19 ENCOUNTER — Inpatient Hospital Stay (HOSPITAL_COMMUNITY): Payer: Medicaid Other

## 2013-12-22 ENCOUNTER — Inpatient Hospital Stay (HOSPITAL_COMMUNITY): Payer: Medicaid Other

## 2013-12-26 ENCOUNTER — Inpatient Hospital Stay (HOSPITAL_COMMUNITY): Payer: Medicaid Other

## 2013-12-29 ENCOUNTER — Inpatient Hospital Stay (HOSPITAL_COMMUNITY): Payer: Medicaid Other

## 2014-01-01 ENCOUNTER — Inpatient Hospital Stay (HOSPITAL_COMMUNITY): Payer: Medicaid Other

## 2014-01-02 ENCOUNTER — Inpatient Hospital Stay (HOSPITAL_COMMUNITY): Payer: Medicaid Other

## 2014-01-05 ENCOUNTER — Inpatient Hospital Stay (HOSPITAL_COMMUNITY): Payer: Medicaid Other

## 2014-01-08 ENCOUNTER — Inpatient Hospital Stay (HOSPITAL_COMMUNITY): Payer: Medicaid Other

## 2014-01-09 ENCOUNTER — Inpatient Hospital Stay (HOSPITAL_COMMUNITY): Payer: Medicaid Other

## 2014-01-11 DEATH — deceased

## 2014-03-22 ENCOUNTER — Encounter (HOSPITAL_COMMUNITY): Payer: Self-pay | Admitting: Vascular Surgery

## 2015-11-12 ENCOUNTER — Encounter: Payer: Self-pay | Admitting: Gastroenterology

## 2016-05-02 ENCOUNTER — Other Ambulatory Visit: Payer: Self-pay | Admitting: Nurse Practitioner
